# Patient Record
Sex: Female | Born: 1970
Health system: Southern US, Community
[De-identification: ages and names within clinical notes are randomized; demographics above are authoritative.]

## PROBLEM LIST (undated history)

## (undated) DIAGNOSIS — Z789 Other specified health status: Secondary | ICD-10-CM

## (undated) DIAGNOSIS — M549 Dorsalgia, unspecified: Secondary | ICD-10-CM

## (undated) DIAGNOSIS — Z9889 Other specified postprocedural states: Secondary | ICD-10-CM

## (undated) DIAGNOSIS — R112 Nausea with vomiting, unspecified: Secondary | ICD-10-CM

## (undated) DIAGNOSIS — G8929 Other chronic pain: Secondary | ICD-10-CM

## (undated) HISTORY — PX: BREAST BIOPSY: SHX20

## (undated) HISTORY — PX: AUGMENTATION MAMMAPLASTY: SUR837

## (undated) HISTORY — PX: BREAST ENHANCEMENT SURGERY: SHX7

---

## 1998-05-17 ENCOUNTER — Other Ambulatory Visit: Admission: RE | Admit: 1998-05-17 | Discharge: 1998-05-17 | Payer: Self-pay | Admitting: Obstetrics and Gynecology

## 1999-05-28 ENCOUNTER — Other Ambulatory Visit: Admission: RE | Admit: 1999-05-28 | Discharge: 1999-05-28 | Payer: Self-pay | Admitting: Obstetrics and Gynecology

## 2000-09-17 ENCOUNTER — Other Ambulatory Visit: Admission: RE | Admit: 2000-09-17 | Discharge: 2000-09-17 | Payer: Self-pay | Admitting: Obstetrics & Gynecology

## 2001-04-23 ENCOUNTER — Inpatient Hospital Stay (HOSPITAL_COMMUNITY): Admission: AD | Admit: 2001-04-23 | Discharge: 2001-04-25 | Payer: Self-pay | Admitting: Obstetrics & Gynecology

## 2001-06-06 ENCOUNTER — Other Ambulatory Visit: Admission: RE | Admit: 2001-06-06 | Discharge: 2001-06-06 | Payer: Self-pay | Admitting: Obstetrics & Gynecology

## 2002-07-10 ENCOUNTER — Other Ambulatory Visit: Admission: RE | Admit: 2002-07-10 | Discharge: 2002-07-10 | Payer: Self-pay | Admitting: Obstetrics & Gynecology

## 2009-11-28 ENCOUNTER — Other Ambulatory Visit: Admission: RE | Admit: 2009-11-28 | Discharge: 2009-11-28 | Payer: Self-pay | Admitting: Family Medicine

## 2010-04-07 ENCOUNTER — Encounter: Admission: RE | Admit: 2010-04-07 | Discharge: 2010-04-07 | Payer: Self-pay | Admitting: Family Medicine

## 2010-10-17 NOTE — H&P (Signed)
[facility]  Patient:    Tracey Floyd, VIGER Visit Number: [id number] MRN: [id number]          Service Type: OBS Location: 910B [id number] Attending Physician:  Esmeralda Arthur Dictated by:   Genia Del, M.D. Admit Date:  04/23/2001                           History and Physical  DATE OF BIRTH:                  08/31/1970  IDENTIFYING INFORMATION:        Ms. Meredeth Ide is a 40 year old G1, last menstrual period on July 09, 2000, for an expected date of delivery April 16, 2001, at [redacted] weeks gestation.  REASON FOR INDUCTION:           Post dates.  HISTORY OF PRESENT ILLNESS:     Fetal movements positive, no vaginal bleeding, no fluid leak, no regular uterine contractions, no PIH symptoms.  An ultrasound was done after four weeks revealing appropriate for gestational age fetus with adequate amounts of amniotic fluid, cephalic presentation. Her cervix was favorable.  PAST MEDICAL HISTORY:           Positive for IBS.  PAST SURGICAL HISTORY:          Negative.  FAMILY HISTORY:                 Positive for breast cancer in maternal grandmother and diabetes mellitus.  MEDICATIONS:                    Prenatal vitamins.  ALLERGIES:                      No known drug allergies.  SOCIAL HISTORY:                 Nonsmoker.  Married.  HISTORY OF PRESENT PREGNANCY:   First trimester normal.  Labs showed hemoglobin of 12.5, platelets 234,000.  Blood group A positive.  Antibodies negative.  RPR nonreactive.  HBsAg negative.  HIV nonreactive.  Rubella immune.  Gonorrhea and Chlamydia are negative.  Pap test within normal limits.  In the second trimester, she had a triple test which was within normal limits. Her ultrasound revealed anatomy was within normal limits with an anterior low lying placenta.  The placenta was rechecked and was normal at 28+ weeks.  One hour GTT was within normal limits at 135 at 28+ weeks.  An ultrasound was done at  36+ weeks for clinically enlarged for gestational age.  Estimated fetal weight was at the 76th percentile.  Amniotic fluid was at the [facility]. Group B strep at 36+ weeks was positive.  A repeat ultrasound at 40 weeks showed appropriate for gestational age.  Blood pressures remained normal throughout pregnancy.  REVIEW OF SYSTEMS:              Constitutional negative.  HEENT negative. Respiratory negative.  Cardiovascular negative.  Gastrointestinal negative. Urinary negative.  Dermatologic, endocrinologic and neurologic negative.  PHYSICAL EXAMINATION:  GENERAL:                        No apparent distress.  VITAL SIGNS:                    Blood pressure is normal.  Afebrile.  Pulse regular.  LUNGS:                          Clear bilaterally.  HEART:                          Regular cardiac rhythm, no murmur.  ABDOMEN:                        The abdomen is gravid.  Uterine height is appropriate.  Cephalic presentation.  PELVIC:                         Vaginal exam on admission 3 cm dilated, 80% effaced.  Posterior vertex -2.  Lower limbs within normal limits.  No clonus. Monitoring of fetal heart rate reactive.  Baseline is 140.  No decelerations. No regular uterine contractions.  IMPRESSION:                     1. G1 at [redacted] weeks gestation for induction                                    post dates.                                 2. Fetal well being reassuring.                                 3. Group B strep positive.  PLAN:                           1. Admit to labor and delivery for induction,                                    artificial rupture of membranes and low                                    dose Pitocin.                                 2. Start penicillin G per protocol.                                 3. Expectant management towards probable                                    vaginal delivery. Dictated by:   Genia Del, M.D. Attending Physician:   Esmeralda Arthur DD:  04/24/01 TD:  04/24/01 Job: [id number]

## 2011-03-23 ENCOUNTER — Other Ambulatory Visit: Payer: Self-pay | Admitting: Family Medicine

## 2011-03-23 DIAGNOSIS — Z1231 Encounter for screening mammogram for malignant neoplasm of breast: Secondary | ICD-10-CM

## 2011-03-31 ENCOUNTER — Ambulatory Visit: Payer: BC Managed Care – PPO | Attending: Family Medicine | Admitting: Physical Therapy

## 2011-03-31 DIAGNOSIS — M545 Low back pain, unspecified: Secondary | ICD-10-CM | POA: Insufficient documentation

## 2011-03-31 DIAGNOSIS — M25519 Pain in unspecified shoulder: Secondary | ICD-10-CM | POA: Insufficient documentation

## 2011-03-31 DIAGNOSIS — IMO0001 Reserved for inherently not codable concepts without codable children: Secondary | ICD-10-CM | POA: Insufficient documentation

## 2011-04-01 ENCOUNTER — Ambulatory Visit: Payer: Self-pay | Admitting: Physical Therapy

## 2011-04-02 ENCOUNTER — Ambulatory Visit: Payer: BC Managed Care – PPO | Attending: Family Medicine

## 2011-04-02 DIAGNOSIS — M25519 Pain in unspecified shoulder: Secondary | ICD-10-CM | POA: Insufficient documentation

## 2011-04-02 DIAGNOSIS — IMO0001 Reserved for inherently not codable concepts without codable children: Secondary | ICD-10-CM | POA: Insufficient documentation

## 2011-04-02 DIAGNOSIS — M545 Low back pain, unspecified: Secondary | ICD-10-CM | POA: Insufficient documentation

## 2011-04-07 ENCOUNTER — Ambulatory Visit: Payer: BC Managed Care – PPO

## 2011-04-07 ENCOUNTER — Ambulatory Visit
Admission: RE | Admit: 2011-04-07 | Discharge: 2011-04-07 | Disposition: A | Discharge status: Home / Self Care | Payer: BC Managed Care – PPO | Source: Ambulatory Visit | Attending: Family Medicine | Admitting: Family Medicine

## 2011-04-07 DIAGNOSIS — Z1231 Encounter for screening mammogram for malignant neoplasm of breast: Secondary | ICD-10-CM

## 2011-04-10 ENCOUNTER — Ambulatory Visit: Payer: BC Managed Care – PPO | Admitting: Physical Therapy

## 2011-04-15 ENCOUNTER — Encounter: Payer: BC Managed Care – PPO | Admitting: Physical Therapy

## 2011-04-15 ENCOUNTER — Other Ambulatory Visit: Payer: Self-pay | Admitting: Family Medicine

## 2011-04-15 DIAGNOSIS — R928 Other abnormal and inconclusive findings on diagnostic imaging of breast: Secondary | ICD-10-CM

## 2011-04-16 ENCOUNTER — Ambulatory Visit: Payer: BC Managed Care – PPO

## 2011-04-17 ENCOUNTER — Ambulatory Visit
Admission: RE | Admit: 2011-04-17 | Discharge: 2011-04-17 | Disposition: A | Discharge status: Home / Self Care | Payer: BC Managed Care – PPO | Source: Ambulatory Visit | Attending: Family Medicine | Admitting: Family Medicine

## 2011-04-17 ENCOUNTER — Ambulatory Visit: Payer: BC Managed Care – PPO

## 2011-04-17 DIAGNOSIS — R928 Other abnormal and inconclusive findings on diagnostic imaging of breast: Secondary | ICD-10-CM

## 2012-05-02 ENCOUNTER — Other Ambulatory Visit: Payer: Self-pay | Admitting: Family Medicine

## 2012-05-02 DIAGNOSIS — R921 Mammographic calcification found on diagnostic imaging of breast: Secondary | ICD-10-CM

## 2012-05-09 ENCOUNTER — Ambulatory Visit
Admission: RE | Admit: 2012-05-09 | Discharge: 2012-05-09 | Disposition: A | Discharge status: Home / Self Care | Payer: BC Managed Care – PPO | Source: Ambulatory Visit | Attending: Family Medicine | Admitting: Family Medicine

## 2012-05-09 DIAGNOSIS — R921 Mammographic calcification found on diagnostic imaging of breast: Secondary | ICD-10-CM

## 2012-07-26 ENCOUNTER — Other Ambulatory Visit: Payer: Self-pay | Admitting: Orthopedic Surgery

## 2012-07-27 ENCOUNTER — Encounter (HOSPITAL_COMMUNITY): Payer: Self-pay | Admitting: Pharmacy Technician

## 2012-08-04 NOTE — Pre-Procedure Instructions (Signed)
Tracey Floyd  08/04/2012   Your procedure is scheduled on:  Wednesday, 08/10/2012  Report to [facility] at 6:30 AM.  Call this number if you have problems the morning of surgery: [phone]   Remember:   Do not eat food or drink liquids after midnight.   Take these medicines the morning of surgery with A SIP OF WATER: Hydrocodone (pain pill)   Do not wear jewelry, make-up or nail polish.  Do not wear lotions, powders, or perfumes. You may wear deodorant.  Do not shave 48 hours prior to surgery. Men may shave face and neck.  Do not bring valuables to the hospital.  Contacts, dentures or bridgework may not be worn into surgery.  Leave suitcase in the car. After surgery it may be brought to your room.  For patients admitted to the hospital, checkout time is 11:00 AM the day of  discharge.   Patients discharged the day of surgery will not be allowed to drive  home.  Name and phone number of your driver:   Special Instructions: Shower using CHG 2 nights before surgery and the night before surgery.  If you shower the day of surgery use CHG.  Use special wash - you have one bottle of CHG for all showers.  You should use approximately 1/3 of the bottle for each shower.   Please read over the following fact sheets that you were given: Pain Booklet, Coughing and Deep Breathing, Blood Transfusion Information and Surgical Site Infection Prevention

## 2012-08-05 ENCOUNTER — Encounter (HOSPITAL_COMMUNITY)
Admission: RE | Admit: 2012-08-05 | Discharge: 2012-08-05 | Disposition: A | Discharge status: Home / Self Care | Payer: BC Managed Care – PPO | Source: Ambulatory Visit | Attending: Orthopedic Surgery | Admitting: Orthopedic Surgery

## 2012-08-05 ENCOUNTER — Encounter (HOSPITAL_COMMUNITY): Payer: Self-pay

## 2012-08-05 HISTORY — DX: Other specified health status: Z78.9

## 2012-08-05 HISTORY — DX: Other specified postprocedural states: Z98.890

## 2012-08-05 HISTORY — DX: Nausea with vomiting, unspecified: R11.2

## 2012-08-05 HISTORY — DX: Dorsalgia, unspecified: M54.9

## 2012-08-05 HISTORY — DX: Other chronic pain: G89.29

## 2012-08-05 LAB — CBC WITH DIFFERENTIAL/PLATELET
Basophils Relative: 1 % (ref 0–1)
Eosinophils Absolute: 0.3 10*3/uL (ref 0.0–0.7)
HCT: 38.6 % (ref 36.0–46.0)
Hemoglobin: 13.4 g/dL (ref 12.0–15.0)
MCH: 33.8 pg (ref 26.0–34.0)
MCHC: 34.7 g/dL (ref 30.0–36.0)
Monocytes Absolute: 0.5 10*3/uL (ref 0.1–1.0)
Monocytes Relative: 11 % (ref 3–12)
Neutrophils Relative %: 52 % (ref 43–77)

## 2012-08-05 LAB — URINALYSIS, ROUTINE W REFLEX MICROSCOPIC
Bilirubin Urine: NEGATIVE
Ketones, ur: NEGATIVE mg/dL
Nitrite: NEGATIVE
Urobilinogen, UA: 0.2 mg/dL (ref 0.0–1.0)
pH: 6 (ref 5.0–8.0)

## 2012-08-05 LAB — PROTIME-INR
INR: 1.06 (ref 0.00–1.49)
Prothrombin Time: 13.7 seconds (ref 11.6–15.2)

## 2012-08-05 LAB — SURGICAL PCR SCREEN: Staphylococcus aureus: NEGATIVE

## 2012-08-05 LAB — TYPE AND SCREEN: Antibody Screen: NEGATIVE

## 2012-08-05 LAB — COMPREHENSIVE METABOLIC PANEL
Albumin: 4.2 g/dL (ref 3.5–5.2)
BUN: 12 mg/dL (ref 6–23)
Creatinine, Ser: 0.7 mg/dL (ref 0.50–1.10)
Total Protein: 7 g/dL (ref 6.0–8.3)

## 2012-08-05 MED ORDER — POVIDONE-IODINE 7.5 % EX SOLN: Freq: Once | CUTANEOUS | Status: DC

## 2012-08-05 NOTE — Progress Notes (Signed)
Pt doesn't have a cardiologist  Denies ever having an echo/stress test/heart cath  [facility] is Dr.Elizabeth Barnes-Medical MD   Denies ekg or cxr in past yr

## 2012-08-09 MED ORDER — CEFAZOLIN SODIUM-DEXTROSE 2-3 GM-% IV SOLR
2.0000 g | INTRAVENOUS | Status: AC
  Administered 2012-08-10: 2 g via INTRAVENOUS
  Filled 2012-08-09: qty 50

## 2012-08-10 ENCOUNTER — Encounter (HOSPITAL_COMMUNITY): Payer: Self-pay | Admitting: *Deleted

## 2012-08-10 ENCOUNTER — Ambulatory Visit (HOSPITAL_COMMUNITY): Payer: BC Managed Care – PPO | Admitting: Anesthesiology

## 2012-08-10 ENCOUNTER — Encounter (HOSPITAL_COMMUNITY): Payer: Self-pay | Admitting: Anesthesiology

## 2012-08-10 ENCOUNTER — Ambulatory Visit (HOSPITAL_COMMUNITY)
Admission: RE | Admit: 2012-08-10 | Discharge: 2012-08-10 | Disposition: A | Discharge status: Home / Self Care | Payer: BC Managed Care – PPO | Source: Ambulatory Visit | Attending: Orthopedic Surgery | Admitting: Orthopedic Surgery

## 2012-08-10 ENCOUNTER — Encounter (HOSPITAL_COMMUNITY)
Admission: RE | Disposition: A | Discharge status: Home / Self Care | Payer: Self-pay | Source: Ambulatory Visit | Attending: Orthopedic Surgery

## 2012-08-10 ENCOUNTER — Ambulatory Visit (HOSPITAL_COMMUNITY): Payer: BC Managed Care – PPO

## 2012-08-10 DIAGNOSIS — M5137 Other intervertebral disc degeneration, lumbosacral region: Secondary | ICD-10-CM | POA: Insufficient documentation

## 2012-08-10 DIAGNOSIS — Z79899 Other long term (current) drug therapy: Secondary | ICD-10-CM | POA: Insufficient documentation

## 2012-08-10 DIAGNOSIS — M51379 Other intervertebral disc degeneration, lumbosacral region without mention of lumbar back pain or lower extremity pain: Secondary | ICD-10-CM | POA: Insufficient documentation

## 2012-08-10 DIAGNOSIS — M5380 Other specified dorsopathies, site unspecified: Secondary | ICD-10-CM | POA: Insufficient documentation

## 2012-08-10 HISTORY — PX: LUMBAR LAMINECTOMY/DECOMPRESSION MICRODISCECTOMY: SHX5026

## 2012-08-10 SURGERY — LUMBAR LAMINECTOMY/DECOMPRESSION MICRODISCECTOMY
Anesthesia: General | Site: Spine Lumbar | Laterality: Left | Wound class: Clean

## 2012-08-10 MED ORDER — METOCLOPRAMIDE HCL 5 MG/ML IJ SOLN: 10.0000 mg | Freq: Once | INTRAMUSCULAR | Status: DC | PRN

## 2012-08-10 MED ORDER — METHYLPREDNISOLONE ACETATE 40 MG/ML IJ SUSP
INTRAMUSCULAR | Status: AC
  Filled 2012-08-10: qty 1

## 2012-08-10 MED ORDER — FENTANYL CITRATE 0.05 MG/ML IJ SOLN
INTRAMUSCULAR | Status: DC | PRN
  Administered 2012-08-10: 50 ug via INTRAVENOUS
  Administered 2012-08-10: 100 ug via INTRAVENOUS

## 2012-08-10 MED ORDER — SENNOSIDES-DOCUSATE SODIUM 8.6-50 MG PO TABS: 1.0000 | ORAL_TABLET | Freq: Every evening | ORAL | Status: DC | PRN

## 2012-08-10 MED ORDER — ALUM & MAG HYDROXIDE-SIMETH 200-200-20 MG/5ML PO SUSP: 30.0000 mL | Freq: Four times a day (QID) | ORAL | Status: DC | PRN

## 2012-08-10 MED ORDER — THROMBIN 20000 UNITS EX SOLR
CUTANEOUS | Status: DC | PRN
  Administered 2012-08-10: 09:00:00 via TOPICAL

## 2012-08-10 MED ORDER — PROPOFOL 10 MG/ML IV BOLUS
INTRAVENOUS | Status: DC | PRN
  Administered 2012-08-10: 200 mg via INTRAVENOUS

## 2012-08-10 MED ORDER — LIDOCAINE HCL 4 % MT SOLN
OROMUCOSAL | Status: DC | PRN
  Administered 2012-08-10: 4 mL via TOPICAL

## 2012-08-10 MED ORDER — HEMOSTATIC AGENTS (NO CHARGE) OPTIME
TOPICAL | Status: DC | PRN
  Administered 2012-08-10: 1

## 2012-08-10 MED ORDER — SODIUM CHLORIDE 0.9 % IJ SOLN: 3.0000 mL | INTRAMUSCULAR | Status: DC | PRN

## 2012-08-10 MED ORDER — BISACODYL 5 MG PO TBEC: 5.0000 mg | DELAYED_RELEASE_TABLET | Freq: Every day | ORAL | Status: DC | PRN

## 2012-08-10 MED ORDER — SODIUM CHLORIDE 0.9 % IV SOLN: 250.0000 mL | INTRAVENOUS | Status: DC

## 2012-08-10 MED ORDER — DIAZEPAM 5 MG PO TABS
5.0000 mg | ORAL_TABLET | Freq: Four times a day (QID) | ORAL | Status: DC | PRN
  Administered 2012-08-10: 5 mg via ORAL

## 2012-08-10 MED ORDER — DIAZEPAM 5 MG PO TABS
ORAL_TABLET | ORAL | Status: AC
  Filled 2012-08-10: qty 1

## 2012-08-10 MED ORDER — LACTATED RINGERS IV SOLN
INTRAVENOUS | Status: DC | PRN
  Administered 2012-08-10 (×2): via INTRAVENOUS

## 2012-08-10 MED ORDER — CEFAZOLIN SODIUM 1-5 GM-% IV SOLN: 1.0000 g | Freq: Three times a day (TID) | INTRAVENOUS | Status: DC

## 2012-08-10 MED ORDER — NEOSTIGMINE METHYLSULFATE 1 MG/ML IJ SOLN
INTRAMUSCULAR | Status: DC | PRN
  Administered 2012-08-10: 3 mg via INTRAVENOUS

## 2012-08-10 MED ORDER — HYDROMORPHONE HCL PF 1 MG/ML IJ SOLN
0.2500 mg | INTRAMUSCULAR | Status: DC | PRN
  Administered 2012-08-10 (×4): 0.5 mg via INTRAVENOUS

## 2012-08-10 MED ORDER — GLYCOPYRROLATE 0.2 MG/ML IJ SOLN
INTRAMUSCULAR | Status: DC | PRN
  Administered 2012-08-10: .4 mg via INTRAVENOUS

## 2012-08-10 MED ORDER — ACETAMINOPHEN 650 MG RE SUPP: 650.0000 mg | RECTAL | Status: DC | PRN

## 2012-08-10 MED ORDER — PHENOL 1.4 % MT LIQD: 1.0000 | OROMUCOSAL | Status: DC | PRN

## 2012-08-10 MED ORDER — ONDANSETRON HCL 4 MG/2ML IJ SOLN
INTRAMUSCULAR | Status: DC | PRN
  Administered 2012-08-10: 4 mg via INTRAVENOUS

## 2012-08-10 MED ORDER — MENTHOL 3 MG MT LOZG: 1.0000 | LOZENGE | OROMUCOSAL | Status: DC | PRN

## 2012-08-10 MED ORDER — DEXAMETHASONE SODIUM PHOSPHATE 4 MG/ML IJ SOLN
INTRAMUSCULAR | Status: DC | PRN
  Administered 2012-08-10: 4 mg via INTRAVENOUS

## 2012-08-10 MED ORDER — HYDROCODONE-ACETAMINOPHEN 5-325 MG PO TABS
ORAL_TABLET | ORAL | Status: AC
  Filled 2012-08-10: qty 2

## 2012-08-10 MED ORDER — HYDROCODONE-ACETAMINOPHEN 5-325 MG PO TABS
1.0000 | ORAL_TABLET | ORAL | Status: DC | PRN
  Administered 2012-08-10: 2 via ORAL

## 2012-08-10 MED ORDER — HYDROMORPHONE HCL PF 1 MG/ML IJ SOLN
INTRAMUSCULAR | Status: AC
  Filled 2012-08-10: qty 1

## 2012-08-10 MED ORDER — OXYCODONE-ACETAMINOPHEN 5-325 MG PO TABS: 1.0000 | ORAL_TABLET | ORAL | Status: DC | PRN

## 2012-08-10 MED ORDER — METHYLPREDNISOLONE ACETATE 40 MG/ML IJ SUSP
INTRAMUSCULAR | Status: DC | PRN
  Administered 2012-08-10: 40 mg

## 2012-08-10 MED ORDER — THROMBIN 20000 UNITS EX SOLR
CUTANEOUS | Status: AC
  Filled 2012-08-10: qty 20000

## 2012-08-10 MED ORDER — BUPIVACAINE-EPINEPHRINE 0.25% -1:200000 IJ SOLN
INTRAMUSCULAR | Status: DC | PRN
  Administered 2012-08-10: 10 mL

## 2012-08-10 MED ORDER — SODIUM CHLORIDE 0.9 % IJ SOLN: 3.0000 mL | Freq: Two times a day (BID) | INTRAMUSCULAR | Status: DC

## 2012-08-10 MED ORDER — MIDAZOLAM HCL 5 MG/5ML IJ SOLN
INTRAMUSCULAR | Status: DC | PRN
  Administered 2012-08-10: 2 mg via INTRAVENOUS

## 2012-08-10 MED ORDER — INDIGOTINDISULFONATE SODIUM 8 MG/ML IJ SOLN
INTRAMUSCULAR | Status: AC
  Filled 2012-08-10: qty 5

## 2012-08-10 MED ORDER — INDIGOTINDISULFONATE SODIUM 8 MG/ML IJ SOLN
INTRAMUSCULAR | Status: DC | PRN
  Administered 2012-08-10: 1 mL

## 2012-08-10 MED ORDER — ACETAMINOPHEN 325 MG PO TABS: 650.0000 mg | ORAL_TABLET | ORAL | Status: DC | PRN

## 2012-08-10 MED ORDER — MORPHINE SULFATE 2 MG/ML IJ SOLN: 1.0000 mg | INTRAMUSCULAR | Status: DC | PRN

## 2012-08-10 MED ORDER — PHENYLEPHRINE HCL 10 MG/ML IJ SOLN
INTRAMUSCULAR | Status: DC | PRN
  Administered 2012-08-10 (×3): 80 ug via INTRAVENOUS

## 2012-08-10 MED ORDER — ROCURONIUM BROMIDE 100 MG/10ML IV SOLN
INTRAVENOUS | Status: DC | PRN
  Administered 2012-08-10: 40 mg via INTRAVENOUS

## 2012-08-10 MED ORDER — ONDANSETRON HCL 4 MG/2ML IJ SOLN: 4.0000 mg | INTRAMUSCULAR | Status: DC | PRN

## 2012-08-10 MED ORDER — EPHEDRINE SULFATE 50 MG/ML IJ SOLN
INTRAMUSCULAR | Status: DC | PRN
  Administered 2012-08-10: 5 mg via INTRAVENOUS

## 2012-08-10 MED ORDER — OXYCODONE HCL 5 MG PO TABS: 5.0000 mg | ORAL_TABLET | Freq: Once | ORAL | Status: DC | PRN

## 2012-08-10 MED ORDER — OXYCODONE HCL 5 MG/5ML PO SOLN: 5.0000 mg | Freq: Once | ORAL | Status: DC | PRN

## 2012-08-10 MED ORDER — BUPIVACAINE-EPINEPHRINE 0.25% -1:200000 IJ SOLN
INTRAMUSCULAR | Status: AC
  Filled 2012-08-10: qty 1

## 2012-08-10 MED ORDER — LIDOCAINE HCL (CARDIAC) 20 MG/ML IV SOLN
INTRAVENOUS | Status: DC | PRN
  Administered 2012-08-10: 50 mg via INTRAVENOUS

## 2012-08-10 MED ORDER — 0.9 % SODIUM CHLORIDE (POUR BTL) OPTIME
TOPICAL | Status: DC | PRN
  Administered 2012-08-10: 1000 mL

## 2012-08-10 SURGICAL SUPPLY — 66 items
BENZOIN TINCTURE PRP APPL 2/3 (GAUZE/BANDAGES/DRESSINGS) ×2 IMPLANT
BUR ROUND PRECISION 4.0 (BURR) ×2 IMPLANT
CANISTER SUCTION 2500CC (MISCELLANEOUS) ×2 IMPLANT
CLOTH BEACON ORANGE TIMEOUT ST (SAFETY) ×2 IMPLANT
CLSR STERI-STRIP ANTIMIC 1/2X4 (GAUZE/BANDAGES/DRESSINGS) ×2 IMPLANT
CORDS BIPOLAR (ELECTRODE) ×2 IMPLANT
COVER SURGICAL LIGHT HANDLE (MISCELLANEOUS) ×2 IMPLANT
DECANTER SPIKE VIAL GLASS SM (MISCELLANEOUS) ×2 IMPLANT
DRAIN CHANNEL 15F RND FF W/TCR (WOUND CARE) IMPLANT
DRAPE POUCH INSTRU U-SHP 10X18 (DRAPES) ×4 IMPLANT
DRAPE SURG 17X23 STRL (DRAPES) ×8 IMPLANT
DURAPREP 26ML APPLICATOR (WOUND CARE) ×2 IMPLANT
ELECT BLADE 4.0 EZ CLEAN MEGAD (MISCELLANEOUS)
ELECT CAUTERY BLADE 6.4 (BLADE) ×2 IMPLANT
ELECT REM PT RETURN 9FT ADLT (ELECTROSURGICAL) ×2
ELECTRODE BLDE 4.0 EZ CLN MEGD (MISCELLANEOUS) IMPLANT
ELECTRODE REM PT RTRN 9FT ADLT (ELECTROSURGICAL) ×1 IMPLANT
EVACUATOR SILICONE 100CC (DRAIN) IMPLANT
FILTER STRAW FLUID ASPIR (MISCELLANEOUS) ×2 IMPLANT
GAUZE SPONGE 4X4 16PLY XRAY LF (GAUZE/BANDAGES/DRESSINGS) ×4 IMPLANT
GLOVE BIO SURGEON STRL SZ7 (GLOVE) ×2 IMPLANT
GLOVE BIO SURGEON STRL SZ8 (GLOVE) ×2 IMPLANT
GLOVE BIOGEL PI IND STRL 7.0 (GLOVE) ×1 IMPLANT
GLOVE BIOGEL PI IND STRL 8 (GLOVE) ×1 IMPLANT
GLOVE BIOGEL PI INDICATOR 7.0 (GLOVE) ×1
GLOVE BIOGEL PI INDICATOR 8 (GLOVE) ×1
GOWN PREVENTION PLUS LG XLONG (DISPOSABLE) ×2 IMPLANT
GOWN STRL NON-REIN LRG LVL3 (GOWN DISPOSABLE) ×4 IMPLANT
IV CATH 14GX2 1/4 (CATHETERS) ×2 IMPLANT
KIT BASIN OR (CUSTOM PROCEDURE TRAY) ×2 IMPLANT
KIT ROOM TURNOVER OR (KITS) ×2 IMPLANT
MATRIX HEMOSTAT SURGIFLO (HEMOSTASIS) ×2 IMPLANT
NEEDLE 18GX1X1/2 (RX/OR ONLY) (NEEDLE) ×2 IMPLANT
NEEDLE 22X1 1/2 (OR ONLY) (NEEDLE) IMPLANT
NEEDLE HYPO 25GX1X1/2 BEV (NEEDLE) ×2 IMPLANT
NEEDLE SPNL 18GX3.5 QUINCKE PK (NEEDLE) ×4 IMPLANT
NS IRRIG 1000ML POUR BTL (IV SOLUTION) ×2 IMPLANT
PACK LAMINECTOMY ORTHO (CUSTOM PROCEDURE TRAY) ×2 IMPLANT
PACK UNIVERSAL I (CUSTOM PROCEDURE TRAY) ×2 IMPLANT
PAD ARMBOARD 7.5X6 YLW CONV (MISCELLANEOUS) ×4 IMPLANT
PATTIES SURGICAL .5 X.5 (GAUZE/BANDAGES/DRESSINGS) IMPLANT
PATTIES SURGICAL .5 X1 (DISPOSABLE) ×2 IMPLANT
SPONGE GAUZE 4X4 12PLY (GAUZE/BANDAGES/DRESSINGS) ×2 IMPLANT
SPONGE INTESTINAL PEANUT (DISPOSABLE) ×2 IMPLANT
SPONGE SURGIFOAM ABS GEL 100 (HEMOSTASIS) ×2 IMPLANT
STRIP CLOSURE SKIN 1/2X4 (GAUZE/BANDAGES/DRESSINGS) IMPLANT
SURGIFLO TRUKIT (HEMOSTASIS) IMPLANT
SUT BONE WAX W31G (SUTURE) ×2 IMPLANT
SUT MNCRL AB 4-0 PS2 18 (SUTURE) ×2 IMPLANT
SUT VIC AB 0 CT1 18XCR BRD 8 (SUTURE) IMPLANT
SUT VIC AB 0 CT1 27 (SUTURE)
SUT VIC AB 0 CT1 27XBRD ANBCTR (SUTURE) IMPLANT
SUT VIC AB 0 CT1 8-18 (SUTURE)
SUT VIC AB 1 CT1 18XCR BRD 8 (SUTURE) ×1 IMPLANT
SUT VIC AB 1 CT1 8-18 (SUTURE) ×1
SUT VIC AB 2-0 CT2 18 VCP726D (SUTURE) ×2 IMPLANT
SYR 20CC LL (SYRINGE) IMPLANT
SYR BULB IRRIGATION 50ML (SYRINGE) ×2 IMPLANT
SYR CONTROL 10ML LL (SYRINGE) ×4 IMPLANT
SYR TB 1ML 26GX3/8 SAFETY (SYRINGE) ×4 IMPLANT
SYR TB 1ML LUER SLIP (SYRINGE) ×4 IMPLANT
TAPE CLOTH SURG 4X10 WHT LF (GAUZE/BANDAGES/DRESSINGS) ×2 IMPLANT
TOWEL OR 17X24 6PK STRL BLUE (TOWEL DISPOSABLE) ×2 IMPLANT
TOWEL OR 17X26 10 PK STRL BLUE (TOWEL DISPOSABLE) ×2 IMPLANT
WATER STERILE IRR 1000ML POUR (IV SOLUTION) ×2 IMPLANT
YANKAUER SUCT BULB TIP NO VENT (SUCTIONS) ×2 IMPLANT

## 2012-08-10 NOTE — Anesthesia Postprocedure Evaluation (Signed)
Anesthesia Post Note  Patient: Tracey Floyd  Procedure(s) Performed: Procedure(s) (LRB): LUMBAR LAMINECTOMY/DECOMPRESSION MICRODISCECTOMY (Left)  Anesthesia type: General  Patient location: PACU  Post pain: Pain level controlled  Post assessment: Patient's Cardiovascular Status Stable  Last Vitals:  Filed Vitals:   08/10/12 1158  BP:   Pulse: 59  Temp:   Resp: 15    Post vital signs: Reviewed and stable  Level of consciousness: alert  Complications: No apparent anesthesia complications

## 2012-08-10 NOTE — Preoperative (Signed)
Beta Blockers   Reason not to administer Beta Blockers:Not Applicable 

## 2012-08-10 NOTE — Anesthesia Preprocedure Evaluation (Signed)
Anesthesia Evaluation  Patient identified by MRN, date of birth, ID band Patient awake    Reviewed: Allergy & Precautions, H&P , NPO status , Patient's Chart, lab work & pertinent test results, reviewed documented beta blocker date and time   Airway Mallampati: II TM Distance: >3 FB Neck ROM: full    Dental   Pulmonary neg pulmonary ROS,  breath sounds clear to auscultation        Cardiovascular negative cardio ROS  Rhythm:regular     Neuro/Psych negative neurological ROS  negative psych ROS   GI/Hepatic negative GI ROS, Neg liver ROS,   Endo/Other  negative endocrine ROS  Renal/GU negative Renal ROS  negative genitourinary   Musculoskeletal   Abdominal   Peds  Hematology negative hematology ROS (+)   Anesthesia Other Findings See surgeon's H&P   Reproductive/Obstetrics negative OB ROS                           Anesthesia Physical Anesthesia Plan  ASA: I  Anesthesia Plan: General   Post-op Pain Management:    Induction: Intravenous  Airway Management Planned: Oral ETT  Additional Equipment:   Intra-op Plan:   Post-operative Plan:   Informed Consent: I have reviewed the patients History and Physical, chart, labs and discussed the procedure including the risks, benefits and alternatives for the proposed anesthesia with the patient or authorized representative who has indicated his/her understanding and acceptance.   Dental Advisory Given  Plan Discussed with: CRNA and Surgeon  Anesthesia Plan Comments:         Anesthesia Quick Evaluation  

## 2012-08-10 NOTE — H&P (Signed)
PREOPERATIVE H&P  Chief Complaint: left leg pain  HPI: Tracey Floyd is a 42 y.o. female who presents with left leg pain  Past Medical History  Diagnosis Date  . PONV (postoperative nausea and vomiting)   . Chronic back pain     left thigh pain  . Medical history non-contributory    Past Surgical History  Procedure Laterality Date  . Breast enhancement surgery     History   Social History  . Marital Status: Single    Spouse Name: N/A    Number of Children: N/A  . Years of Education: N/A   Social History Main Topics  . Smoking status: Never Smoker   . Smokeless tobacco: None  . Alcohol Use: No  . Drug Use: No  . Sexually Active: Yes    Birth Control/ Protection: IUD   Other Topics Concern  . None   Social History Narrative  . None   History reviewed. No pertinent family history. No Known Allergies Prior to Admission medications   Medication Sig Start Date End Date Taking? Authorizing Provider  HYDROcodone-acetaminophen (NORCO/VICODIN) 5-325 MG per tablet Take 1-2 tablets by mouth every 6 (six) hours as needed for pain.   Yes Historical Provider, MD  Levonorgestrel (MIRENA IU) 1 application by Intrauterine route once.   Yes Historical Provider, MD  Multiple Vitamins-Minerals (MULTIVITAMIN WITH MINERALS) tablet Take 1 tablet by mouth daily.   Yes Historical Provider, MD     All other systems have been reviewed and were otherwise negative with the exception of those mentioned in the HPI and as above.  Physical Exam: Filed Vitals:   08/10/12 0659  BP: 106/73  Pulse: 69  Temp: 98 F (36.7 C)  Resp: 20    General: Alert, no acute distress Cardiovascular: No pedal edema Respiratory: No cyanosis, no use of accessory musculature Skin: No lesions in the area of chief complaint Neurologic: Sensation intact distally Psychiatric: Patient is competent for consent with normal mood and affect Lymphatic: No axillary or cervical lymphadenopathy  MUSCULOSKELETAL: +  SLR on left  Assessment/Plan: Posterior left thigh pain Plan for Procedure(s): LUMBAR LAMINECTOMY/DECOMPRESSION MICRODISCECTOMY L5/S1   Emilee Hero, MD 08/10/2012 8:23 AM

## 2012-08-10 NOTE — Transfer of Care (Signed)
Immediate Anesthesia Transfer of Care Note  Patient: Tracey Floyd  Procedure(s) Performed: Procedure(s) with comments: LUMBAR LAMINECTOMY/DECOMPRESSION MICRODISCECTOMY (Left) - Left sided lumar 5-sacrum 1 microdisectomy  Patient Location: PACU  Anesthesia Type:General  Level of Consciousness: awake, alert  and oriented  Airway & Oxygen Therapy: Patient Spontanous Breathing and Patient connected to nasal cannula oxygen  Post-op Assessment: Report given to PACU RN and Post -op Vital signs reviewed and stable  Post vital signs: Reviewed and stable  Complications: No apparent anesthesia complications

## 2012-08-11 NOTE — Op Note (Signed)
[name], Floyd                ACCOUNT NO.:  [id number]  MEDICAL RECORD NO.:  [id number]  LOCATION:  [facility]                         FACILITY:  [facility]  PHYSICIAN:  Estill Bamberg, MD      DATE OF BIRTH:  December 22, 1970  DATE OF PROCEDURE:  08/10/2012 DATE OF DISCHARGE:  08/10/2012                              OPERATIVE REPORT  PREOPERATIVE DIAGNOSES: 1. Left-sided S1 radiculopathy. 2. Left-sided S2 radiculopathy. 3. Left-sided L5-S1 disk osteophyte complex, compressing the     traversing S1 and S2 nerves.  POSTOPERATIVE DIAGNOSES: 1. Left-sided S1 radiculopathy. 2. Left-sided S2 radiculopathy. 3. Left-sided L5-S1 disk osteophyte complex, compressing the     traversing S1 and S2 nerves.  PROCEDURE:  Left-sided L5-S1 microdiskectomy.  SURGEON:  Estill Bamberg, MD  ASSISTANT:  Jason Coop, [facility]  ANESTHESIA:  General endotracheal anesthesia.  COMPLICATIONS:  None.  DISPOSITION:  Stable.  FINDINGS:  Very chronic appearing calcified left-sided L5-S1 disk fragment with prominent bony spurring posteriorly, causing severe compression of the traversing S1 and S2 nerves.  INDICATIONS FOR PROCEDURE:  Briefly, Tracey Floyd is a very pleasant 42 year old female who did initially present to me on May 09, 2012, with severe pain in her left leg.  At that point in time, the patient did have about a 68-month history of severe pain at the posterior aspect of her left thigh.  Her pain was ongoing at the time of my evaluation, and it was noted to be severe.  We did go forward with an MRI which was clearly notable for a very large pedunculated disk osteophyte complex of the left side, causing severe compression on the traversing S1 and S2 nerves. The patient did have epidural injections which gave her temporary relief, however, pain did quickly recur after each of them.  Upon her evaluation with me on July 25, 2012, we did have a discussion regarding going forward with a microdiskectomy  procedure.  She did understand the risks and benefits of the procedure.  OPERATIVE DETAILS:  On August 10, 2012, the patient was brought to the surgery and general endotracheal anesthesia was administered.  The patient was placed prone on a well-padded flat Jean Rosenthal table with the Wilson frame.  Antibiotics were given.  A time-out procedure was performed.  I then made a 1-inch incision overlying the L5-S1 interspace.  The lamina of L5 and S1 was readily identified.  I did obtain an additional lateral intraoperative radiograph to confirm the appropriate operative level.  The ligamentum flavum between L5 and S1 was taken down.  The traversing S2 nerve was readily identified and the traversing S1 nerve was noted to be significantly displaced against the S1 pedicle towards the left.  Also, it was readily apparent that there was a very obvious and rather significant pedunculated osteophyte complex projecting posteriorly.  This was causing medial displacement of the S2 nerve and lateral displacement of the S1 nerve.  I did tease away the superficial layers overlying the protrusion and it was clear that there were calcified disk fragment and bone fragment, substantially displacing the nerves.  With an assistant holding careful medial retraction of the traversing S2 nerve, I did use a reverse angled Epstein curette to  displace the osteophytes into the intervertebral space.  Once they were free from the surrounding L5 and S1 bone, I was able to remove them using a series of pituitary rongeurs.  I did need to do this liberally both medially and laterally at the posterolateral aspect of the region of the annulus, in order to thoroughly decompress both the S1 and the S2 nerves.  At the termination of this portion of the procedure, I did evaluate the S1 and S2 nerves.  I did feel that excellent decompression was obtained.  Also of note, it was obvious that the patient did have rather substantial and  significant L5-S1 degenerative disk disease.  I was, however, able to confirm adequate decompression of the nerves.  The findings of the significant osteophyte complex posteriorly in the calcified fragment did give me some concern for the future fate of the L5-S1 disk, and an additional surgery may be required, if the disk were to continue to degenerate.  This was certainly be mentioned to Ms. Meredeth Ide and her husband.  In any event, at the termination of this portion of the procedure, I did copiously irrigate the wound.  I did control epidural bleeding using bipolar electrocautery in addition to Surgiflo.  A 40 mg of Depo-Medrol was infiltrated above the epidural space.  The bleeding bone edges were controlled using bone wax.  I then closed the fascia using #1 Vicryl. The subcutaneous layer was then closed using 2-0 Vicryl and the skin was closed using 3-0 Monocryl, benzoin and Steri-Strips.  All instrument counts were correct at the termination of the procedure.  Of note, Jason Coop, was my assistant throughout the procedure and aided in essential retraction and suctioning required throughout the surgery.     Estill Bamberg, MD     MD/MEDQ  D:  08/10/2012  T:  08/11/2012  Job:  [id number]  cc:   Dr. Juluis Rainier

## 2012-08-13 ENCOUNTER — Encounter (HOSPITAL_COMMUNITY): Payer: Self-pay | Admitting: Orthopedic Surgery

## 2015-02-14 ENCOUNTER — Other Ambulatory Visit: Payer: Self-pay | Admitting: Obstetrics & Gynecology

## 2015-02-14 DIAGNOSIS — R928 Other abnormal and inconclusive findings on diagnostic imaging of breast: Secondary | ICD-10-CM

## 2015-02-20 ENCOUNTER — Ambulatory Visit
Admission: RE | Admit: 2015-02-20 | Discharge: 2015-02-20 | Disposition: A | Discharge status: Home / Self Care | Payer: 59 | Source: Ambulatory Visit | Attending: Obstetrics & Gynecology | Admitting: Obstetrics & Gynecology

## 2015-02-20 ENCOUNTER — Other Ambulatory Visit: Payer: Self-pay | Admitting: Obstetrics & Gynecology

## 2015-02-20 DIAGNOSIS — R928 Other abnormal and inconclusive findings on diagnostic imaging of breast: Secondary | ICD-10-CM

## 2015-02-20 DIAGNOSIS — N6320 Unspecified lump in the left breast, unspecified quadrant: Secondary | ICD-10-CM

## 2015-03-04 ENCOUNTER — Other Ambulatory Visit: Payer: Self-pay | Admitting: Obstetrics & Gynecology

## 2015-03-04 ENCOUNTER — Ambulatory Visit
Admission: RE | Admit: 2015-03-04 | Discharge: 2015-03-04 | Disposition: A | Discharge status: Home / Self Care | Payer: 59 | Source: Ambulatory Visit | Attending: Obstetrics & Gynecology | Admitting: Obstetrics & Gynecology

## 2015-03-04 DIAGNOSIS — N6320 Unspecified lump in the left breast, unspecified quadrant: Secondary | ICD-10-CM

## 2016-02-05 ENCOUNTER — Other Ambulatory Visit: Payer: Self-pay | Admitting: Family Medicine

## 2016-02-05 DIAGNOSIS — Z1231 Encounter for screening mammogram for malignant neoplasm of breast: Secondary | ICD-10-CM

## 2016-03-11 ENCOUNTER — Ambulatory Visit
Admission: RE | Admit: 2016-03-11 | Discharge: 2016-03-11 | Disposition: A | Discharge status: Home / Self Care | Payer: 59 | Source: Ambulatory Visit | Attending: Family Medicine | Admitting: Family Medicine

## 2016-03-11 ENCOUNTER — Other Ambulatory Visit: Payer: Self-pay | Admitting: Family Medicine

## 2016-03-11 DIAGNOSIS — Z1231 Encounter for screening mammogram for malignant neoplasm of breast: Secondary | ICD-10-CM

## 2016-07-07 DIAGNOSIS — J069 Acute upper respiratory infection, unspecified: Secondary | ICD-10-CM | POA: Diagnosis not present

## 2017-02-02 ENCOUNTER — Other Ambulatory Visit: Payer: Self-pay | Admitting: Family Medicine

## 2017-02-02 DIAGNOSIS — Z1231 Encounter for screening mammogram for malignant neoplasm of breast: Secondary | ICD-10-CM

## 2017-03-17 ENCOUNTER — Ambulatory Visit
Admission: RE | Admit: 2017-03-17 | Discharge: 2017-03-17 | Disposition: A | Discharge status: Home / Self Care | Payer: BLUE CROSS/BLUE SHIELD | Source: Ambulatory Visit | Attending: Family Medicine | Admitting: Family Medicine

## 2017-03-17 DIAGNOSIS — Z1231 Encounter for screening mammogram for malignant neoplasm of breast: Secondary | ICD-10-CM

## 2017-03-18 ENCOUNTER — Other Ambulatory Visit: Payer: Self-pay | Admitting: Family Medicine

## 2017-03-18 DIAGNOSIS — R928 Other abnormal and inconclusive findings on diagnostic imaging of breast: Secondary | ICD-10-CM

## 2017-03-24 ENCOUNTER — Ambulatory Visit
Admission: RE | Admit: 2017-03-24 | Discharge: 2017-03-24 | Disposition: A | Discharge status: Home / Self Care | Payer: BLUE CROSS/BLUE SHIELD | Source: Ambulatory Visit | Attending: Family Medicine | Admitting: Family Medicine

## 2017-03-24 DIAGNOSIS — R928 Other abnormal and inconclusive findings on diagnostic imaging of breast: Secondary | ICD-10-CM

## 2017-03-24 DIAGNOSIS — N6320 Unspecified lump in the left breast, unspecified quadrant: Secondary | ICD-10-CM | POA: Diagnosis not present

## 2017-03-24 DIAGNOSIS — R922 Inconclusive mammogram: Secondary | ICD-10-CM | POA: Diagnosis not present

## 2017-07-05 DIAGNOSIS — L7211 Pilar cyst: Secondary | ICD-10-CM | POA: Diagnosis not present

## 2017-07-22 DIAGNOSIS — L7211 Pilar cyst: Secondary | ICD-10-CM | POA: Diagnosis not present

## 2018-02-22 DIAGNOSIS — Z0000 Encounter for general adult medical examination without abnormal findings: Secondary | ICD-10-CM | POA: Diagnosis not present

## 2018-02-22 DIAGNOSIS — Z13220 Encounter for screening for lipoid disorders: Secondary | ICD-10-CM | POA: Diagnosis not present

## 2018-03-03 ENCOUNTER — Other Ambulatory Visit: Payer: Self-pay | Admitting: Family Medicine

## 2018-03-03 DIAGNOSIS — Z1231 Encounter for screening mammogram for malignant neoplasm of breast: Secondary | ICD-10-CM

## 2018-03-21 DIAGNOSIS — Z23 Encounter for immunization: Secondary | ICD-10-CM | POA: Diagnosis not present

## 2018-04-04 ENCOUNTER — Ambulatory Visit
Admission: RE | Admit: 2018-04-04 | Discharge: 2018-04-04 | Disposition: A | Discharge status: Home / Self Care | Payer: 59 | Source: Ambulatory Visit | Attending: Family Medicine | Admitting: Family Medicine

## 2018-04-04 DIAGNOSIS — Z1231 Encounter for screening mammogram for malignant neoplasm of breast: Secondary | ICD-10-CM | POA: Diagnosis not present

## 2018-04-20 DIAGNOSIS — J3089 Other allergic rhinitis: Secondary | ICD-10-CM | POA: Diagnosis not present

## 2018-04-20 DIAGNOSIS — R05 Cough: Secondary | ICD-10-CM | POA: Diagnosis not present

## 2018-04-20 DIAGNOSIS — J3081 Allergic rhinitis due to animal (cat) (dog) hair and dander: Secondary | ICD-10-CM | POA: Diagnosis not present

## 2018-04-20 DIAGNOSIS — J301 Allergic rhinitis due to pollen: Secondary | ICD-10-CM | POA: Diagnosis not present

## 2018-05-04 DIAGNOSIS — J3081 Allergic rhinitis due to animal (cat) (dog) hair and dander: Secondary | ICD-10-CM | POA: Diagnosis not present

## 2018-05-04 DIAGNOSIS — J301 Allergic rhinitis due to pollen: Secondary | ICD-10-CM | POA: Diagnosis not present

## 2018-05-04 DIAGNOSIS — J3089 Other allergic rhinitis: Secondary | ICD-10-CM | POA: Diagnosis not present

## 2018-05-12 DIAGNOSIS — J301 Allergic rhinitis due to pollen: Secondary | ICD-10-CM | POA: Diagnosis not present

## 2018-05-12 DIAGNOSIS — J3081 Allergic rhinitis due to animal (cat) (dog) hair and dander: Secondary | ICD-10-CM | POA: Diagnosis not present

## 2018-05-12 DIAGNOSIS — J3089 Other allergic rhinitis: Secondary | ICD-10-CM | POA: Diagnosis not present

## 2018-05-16 DIAGNOSIS — J301 Allergic rhinitis due to pollen: Secondary | ICD-10-CM | POA: Diagnosis not present

## 2018-05-16 DIAGNOSIS — J3081 Allergic rhinitis due to animal (cat) (dog) hair and dander: Secondary | ICD-10-CM | POA: Diagnosis not present

## 2018-05-16 DIAGNOSIS — J3089 Other allergic rhinitis: Secondary | ICD-10-CM | POA: Diagnosis not present

## 2018-05-18 DIAGNOSIS — J3081 Allergic rhinitis due to animal (cat) (dog) hair and dander: Secondary | ICD-10-CM | POA: Diagnosis not present

## 2018-05-18 DIAGNOSIS — J3089 Other allergic rhinitis: Secondary | ICD-10-CM | POA: Diagnosis not present

## 2018-05-18 DIAGNOSIS — J301 Allergic rhinitis due to pollen: Secondary | ICD-10-CM | POA: Diagnosis not present

## 2018-05-23 DIAGNOSIS — J301 Allergic rhinitis due to pollen: Secondary | ICD-10-CM | POA: Diagnosis not present

## 2018-05-23 DIAGNOSIS — J3089 Other allergic rhinitis: Secondary | ICD-10-CM | POA: Diagnosis not present

## 2018-05-23 DIAGNOSIS — J3081 Allergic rhinitis due to animal (cat) (dog) hair and dander: Secondary | ICD-10-CM | POA: Diagnosis not present

## 2018-05-26 DIAGNOSIS — J301 Allergic rhinitis due to pollen: Secondary | ICD-10-CM | POA: Diagnosis not present

## 2018-05-26 DIAGNOSIS — J3089 Other allergic rhinitis: Secondary | ICD-10-CM | POA: Diagnosis not present

## 2018-05-26 DIAGNOSIS — J3081 Allergic rhinitis due to animal (cat) (dog) hair and dander: Secondary | ICD-10-CM | POA: Diagnosis not present

## 2018-05-30 DIAGNOSIS — J3089 Other allergic rhinitis: Secondary | ICD-10-CM | POA: Diagnosis not present

## 2018-05-30 DIAGNOSIS — J301 Allergic rhinitis due to pollen: Secondary | ICD-10-CM | POA: Diagnosis not present

## 2018-05-30 DIAGNOSIS — J3081 Allergic rhinitis due to animal (cat) (dog) hair and dander: Secondary | ICD-10-CM | POA: Diagnosis not present

## 2018-06-02 DIAGNOSIS — J3081 Allergic rhinitis due to animal (cat) (dog) hair and dander: Secondary | ICD-10-CM | POA: Diagnosis not present

## 2018-06-02 DIAGNOSIS — J301 Allergic rhinitis due to pollen: Secondary | ICD-10-CM | POA: Diagnosis not present

## 2018-06-02 DIAGNOSIS — J3089 Other allergic rhinitis: Secondary | ICD-10-CM | POA: Diagnosis not present

## 2018-06-06 DIAGNOSIS — J301 Allergic rhinitis due to pollen: Secondary | ICD-10-CM | POA: Diagnosis not present

## 2018-06-06 DIAGNOSIS — J3089 Other allergic rhinitis: Secondary | ICD-10-CM | POA: Diagnosis not present

## 2018-06-06 DIAGNOSIS — J3081 Allergic rhinitis due to animal (cat) (dog) hair and dander: Secondary | ICD-10-CM | POA: Diagnosis not present

## 2018-06-09 DIAGNOSIS — J3089 Other allergic rhinitis: Secondary | ICD-10-CM | POA: Diagnosis not present

## 2018-06-09 DIAGNOSIS — J3081 Allergic rhinitis due to animal (cat) (dog) hair and dander: Secondary | ICD-10-CM | POA: Diagnosis not present

## 2018-06-09 DIAGNOSIS — J301 Allergic rhinitis due to pollen: Secondary | ICD-10-CM | POA: Diagnosis not present

## 2018-06-16 DIAGNOSIS — J3089 Other allergic rhinitis: Secondary | ICD-10-CM | POA: Diagnosis not present

## 2018-06-16 DIAGNOSIS — J301 Allergic rhinitis due to pollen: Secondary | ICD-10-CM | POA: Diagnosis not present

## 2018-06-16 DIAGNOSIS — J3081 Allergic rhinitis due to animal (cat) (dog) hair and dander: Secondary | ICD-10-CM | POA: Diagnosis not present

## 2018-06-22 DIAGNOSIS — J301 Allergic rhinitis due to pollen: Secondary | ICD-10-CM | POA: Diagnosis not present

## 2018-06-22 DIAGNOSIS — J3081 Allergic rhinitis due to animal (cat) (dog) hair and dander: Secondary | ICD-10-CM | POA: Diagnosis not present

## 2018-06-22 DIAGNOSIS — J3089 Other allergic rhinitis: Secondary | ICD-10-CM | POA: Diagnosis not present

## 2018-06-27 DIAGNOSIS — J3089 Other allergic rhinitis: Secondary | ICD-10-CM | POA: Diagnosis not present

## 2018-06-27 DIAGNOSIS — J3081 Allergic rhinitis due to animal (cat) (dog) hair and dander: Secondary | ICD-10-CM | POA: Diagnosis not present

## 2018-06-27 DIAGNOSIS — J301 Allergic rhinitis due to pollen: Secondary | ICD-10-CM | POA: Diagnosis not present

## 2018-06-29 DIAGNOSIS — J3081 Allergic rhinitis due to animal (cat) (dog) hair and dander: Secondary | ICD-10-CM | POA: Diagnosis not present

## 2018-06-29 DIAGNOSIS — J3089 Other allergic rhinitis: Secondary | ICD-10-CM | POA: Diagnosis not present

## 2018-06-29 DIAGNOSIS — J301 Allergic rhinitis due to pollen: Secondary | ICD-10-CM | POA: Diagnosis not present

## 2018-06-30 DIAGNOSIS — R1010 Upper abdominal pain, unspecified: Secondary | ICD-10-CM | POA: Diagnosis not present

## 2018-07-04 DIAGNOSIS — J3089 Other allergic rhinitis: Secondary | ICD-10-CM | POA: Diagnosis not present

## 2018-07-04 DIAGNOSIS — J301 Allergic rhinitis due to pollen: Secondary | ICD-10-CM | POA: Diagnosis not present

## 2018-07-04 DIAGNOSIS — J3081 Allergic rhinitis due to animal (cat) (dog) hair and dander: Secondary | ICD-10-CM | POA: Diagnosis not present

## 2018-07-11 DIAGNOSIS — J3081 Allergic rhinitis due to animal (cat) (dog) hair and dander: Secondary | ICD-10-CM | POA: Diagnosis not present

## 2018-07-11 DIAGNOSIS — J301 Allergic rhinitis due to pollen: Secondary | ICD-10-CM | POA: Diagnosis not present

## 2018-07-11 DIAGNOSIS — J3089 Other allergic rhinitis: Secondary | ICD-10-CM | POA: Diagnosis not present

## 2018-07-18 DIAGNOSIS — J301 Allergic rhinitis due to pollen: Secondary | ICD-10-CM | POA: Diagnosis not present

## 2018-07-18 DIAGNOSIS — J3081 Allergic rhinitis due to animal (cat) (dog) hair and dander: Secondary | ICD-10-CM | POA: Diagnosis not present

## 2018-07-18 DIAGNOSIS — J3089 Other allergic rhinitis: Secondary | ICD-10-CM | POA: Diagnosis not present

## 2018-07-28 DIAGNOSIS — J3089 Other allergic rhinitis: Secondary | ICD-10-CM | POA: Diagnosis not present

## 2018-07-28 DIAGNOSIS — J301 Allergic rhinitis due to pollen: Secondary | ICD-10-CM | POA: Diagnosis not present

## 2018-07-28 DIAGNOSIS — J3081 Allergic rhinitis due to animal (cat) (dog) hair and dander: Secondary | ICD-10-CM | POA: Diagnosis not present

## 2019-01-31 DIAGNOSIS — J3081 Allergic rhinitis due to animal (cat) (dog) hair and dander: Secondary | ICD-10-CM | POA: Diagnosis not present

## 2019-01-31 DIAGNOSIS — J301 Allergic rhinitis due to pollen: Secondary | ICD-10-CM | POA: Diagnosis not present

## 2019-01-31 DIAGNOSIS — J3089 Other allergic rhinitis: Secondary | ICD-10-CM | POA: Diagnosis not present

## 2019-03-02 DIAGNOSIS — Z0000 Encounter for general adult medical examination without abnormal findings: Secondary | ICD-10-CM | POA: Diagnosis not present

## 2019-03-03 DIAGNOSIS — Z0000 Encounter for general adult medical examination without abnormal findings: Secondary | ICD-10-CM | POA: Diagnosis not present

## 2019-03-03 DIAGNOSIS — Z13220 Encounter for screening for lipoid disorders: Secondary | ICD-10-CM | POA: Diagnosis not present

## 2019-03-08 ENCOUNTER — Other Ambulatory Visit: Payer: Self-pay | Admitting: Family Medicine

## 2019-03-08 DIAGNOSIS — R05 Cough: Secondary | ICD-10-CM | POA: Diagnosis not present

## 2019-03-08 DIAGNOSIS — Z1231 Encounter for screening mammogram for malignant neoplasm of breast: Secondary | ICD-10-CM

## 2019-03-08 DIAGNOSIS — J3081 Allergic rhinitis due to animal (cat) (dog) hair and dander: Secondary | ICD-10-CM | POA: Diagnosis not present

## 2019-03-08 DIAGNOSIS — J3089 Other allergic rhinitis: Secondary | ICD-10-CM | POA: Diagnosis not present

## 2019-03-08 DIAGNOSIS — R0982 Postnasal drip: Secondary | ICD-10-CM | POA: Diagnosis not present

## 2019-03-08 DIAGNOSIS — J301 Allergic rhinitis due to pollen: Secondary | ICD-10-CM | POA: Diagnosis not present

## 2019-03-09 DIAGNOSIS — J3081 Allergic rhinitis due to animal (cat) (dog) hair and dander: Secondary | ICD-10-CM | POA: Diagnosis not present

## 2019-03-09 DIAGNOSIS — J3089 Other allergic rhinitis: Secondary | ICD-10-CM | POA: Diagnosis not present

## 2019-03-09 DIAGNOSIS — J301 Allergic rhinitis due to pollen: Secondary | ICD-10-CM | POA: Diagnosis not present

## 2019-03-23 DIAGNOSIS — J301 Allergic rhinitis due to pollen: Secondary | ICD-10-CM | POA: Diagnosis not present

## 2019-03-23 DIAGNOSIS — J3081 Allergic rhinitis due to animal (cat) (dog) hair and dander: Secondary | ICD-10-CM | POA: Diagnosis not present

## 2019-03-23 DIAGNOSIS — J3089 Other allergic rhinitis: Secondary | ICD-10-CM | POA: Diagnosis not present

## 2019-03-31 DIAGNOSIS — K219 Gastro-esophageal reflux disease without esophagitis: Secondary | ICD-10-CM | POA: Diagnosis not present

## 2019-03-31 DIAGNOSIS — T7840XA Allergy, unspecified, initial encounter: Secondary | ICD-10-CM | POA: Diagnosis not present

## 2019-03-31 DIAGNOSIS — R6889 Other general symptoms and signs: Secondary | ICD-10-CM | POA: Diagnosis not present

## 2019-04-04 DIAGNOSIS — J3089 Other allergic rhinitis: Secondary | ICD-10-CM | POA: Diagnosis not present

## 2019-04-04 DIAGNOSIS — J301 Allergic rhinitis due to pollen: Secondary | ICD-10-CM | POA: Diagnosis not present

## 2019-04-04 DIAGNOSIS — J3081 Allergic rhinitis due to animal (cat) (dog) hair and dander: Secondary | ICD-10-CM | POA: Diagnosis not present

## 2019-04-18 DIAGNOSIS — J301 Allergic rhinitis due to pollen: Secondary | ICD-10-CM | POA: Diagnosis not present

## 2019-04-18 DIAGNOSIS — J3089 Other allergic rhinitis: Secondary | ICD-10-CM | POA: Diagnosis not present

## 2019-04-18 DIAGNOSIS — J3081 Allergic rhinitis due to animal (cat) (dog) hair and dander: Secondary | ICD-10-CM | POA: Diagnosis not present

## 2019-04-21 DIAGNOSIS — Z111 Encounter for screening for respiratory tuberculosis: Secondary | ICD-10-CM | POA: Diagnosis not present

## 2019-04-25 ENCOUNTER — Ambulatory Visit
Admission: RE | Admit: 2019-04-25 | Discharge: 2019-04-25 | Disposition: A | Discharge status: Home / Self Care | Payer: BC Managed Care – PPO | Source: Ambulatory Visit | Attending: Family Medicine | Admitting: Family Medicine

## 2019-04-25 ENCOUNTER — Other Ambulatory Visit: Payer: Self-pay

## 2019-04-25 DIAGNOSIS — Z1231 Encounter for screening mammogram for malignant neoplasm of breast: Secondary | ICD-10-CM

## 2019-05-01 ENCOUNTER — Other Ambulatory Visit: Payer: Self-pay | Admitting: Family Medicine

## 2019-05-01 DIAGNOSIS — R928 Other abnormal and inconclusive findings on diagnostic imaging of breast: Secondary | ICD-10-CM

## 2019-05-02 DIAGNOSIS — J301 Allergic rhinitis due to pollen: Secondary | ICD-10-CM | POA: Diagnosis not present

## 2019-05-02 DIAGNOSIS — J3089 Other allergic rhinitis: Secondary | ICD-10-CM | POA: Diagnosis not present

## 2019-05-02 DIAGNOSIS — J3081 Allergic rhinitis due to animal (cat) (dog) hair and dander: Secondary | ICD-10-CM | POA: Diagnosis not present

## 2019-05-03 ENCOUNTER — Ambulatory Visit: Payer: BC Managed Care – PPO

## 2019-05-03 ENCOUNTER — Other Ambulatory Visit: Payer: Self-pay

## 2019-05-03 ENCOUNTER — Other Ambulatory Visit: Payer: Self-pay | Admitting: Family Medicine

## 2019-05-03 ENCOUNTER — Ambulatory Visit
Admission: RE | Admit: 2019-05-03 | Discharge: 2019-05-03 | Disposition: A | Discharge status: Home / Self Care | Payer: BC Managed Care – PPO | Source: Ambulatory Visit | Attending: Family Medicine | Admitting: Family Medicine

## 2019-05-03 DIAGNOSIS — R922 Inconclusive mammogram: Secondary | ICD-10-CM | POA: Diagnosis not present

## 2019-05-03 DIAGNOSIS — R928 Other abnormal and inconclusive findings on diagnostic imaging of breast: Secondary | ICD-10-CM

## 2019-05-11 ENCOUNTER — Ambulatory Visit (INDEPENDENT_AMBULATORY_CARE_PROVIDER_SITE_OTHER): Payer: BC Managed Care – PPO | Admitting: Allergy

## 2019-05-11 ENCOUNTER — Other Ambulatory Visit: Payer: Self-pay

## 2019-05-11 ENCOUNTER — Encounter: Payer: Self-pay | Admitting: Allergy

## 2019-05-11 VITALS — BP 120/82 | HR 62 | Temp 98.2°F | Resp 16 | Ht 63.0 in | Wt 168.8 lb

## 2019-05-11 DIAGNOSIS — Z03818 Encounter for observation for suspected exposure to other biological agents ruled out: Secondary | ICD-10-CM | POA: Diagnosis not present

## 2019-05-11 DIAGNOSIS — J3089 Other allergic rhinitis: Secondary | ICD-10-CM

## 2019-05-11 DIAGNOSIS — H1013 Acute atopic conjunctivitis, bilateral: Secondary | ICD-10-CM

## 2019-05-11 MED ORDER — FLUTICASONE PROPIONATE 50 MCG/ACT NA SUSP: 2.0000 | Freq: Every day | NASAL | 5 refills | Status: DC

## 2019-05-11 MED ORDER — EPINEPHRINE 0.3 MG/0.3ML IJ SOAJ: 0.3000 mg | INTRAMUSCULAR | 2 refills | Status: DC | PRN

## 2019-05-11 MED ORDER — IPRATROPIUM BROMIDE 0.06 % NA SOLN: 2.0000 | Freq: Three times a day (TID) | NASAL | 5 refills | Status: DC

## 2019-05-11 MED ORDER — LEVOCETIRIZINE DIHYDROCHLORIDE 5 MG PO TABS: 5.0000 mg | ORAL_TABLET | Freq: Every day | ORAL | 5 refills | Status: DC

## 2019-05-11 NOTE — Patient Instructions (Signed)
Allergies  - continue avoidance measures for tree pollens, grass pollens, weed pollens, molds, dust mites, cat, dog  - will restart allergen immunotherapy (allergy shots).  Allergen immunotherapy discussed today including protocol, benefits and risk.  Consent to restart immunotherapy signed today.  Schedule new start injection appointment.   Will prescribe epinephrine device to have with you on injection days.  - continue Levoceterizine 5mg  daily.  Make sure to take prior to injections  - use nasal Ipratropium 2 sprays each nostril 3-4 times a day as needed for nasal drainage  - use Flonase 2 sprays each nostril daily for nasal congestion.  Use for 1-2 weeks at a time before stopping once symptoms improve  - may benefit from nasal saline rinses as well to help flush out the sinuses and help your nasal sprays work more efficiently  Follow-up 6 months or sooner if needed

## 2019-05-11 NOTE — Progress Notes (Signed)
New Patient Note  RE: ALIZON SCHMELING MRN: [id number] DOB: 24-Oct-1970 Date of Office Visit: 05/11/2019  Referring provider: Leighton Ruff, MD Primary care provider: Leighton Ruff, MD  Chief Complaint: allergies, resume shots  History of present illness: MCKYNLIE VANDERSLICE is a 48 y.o. female presenting today for consultation for allergic rhinitis.    She reports allergy symptoms of sneezing, itchy eyes, nasal drainage with drip with cough.   She is currently taking levoceterizine daily.   She will use Flonase as needed for congestion which she states helps some.  she has tried azelastine nasal spray but states she could not tell a difference with degree of drainage with use.  she also has nasal ipratropium which works better than the azelastine and uses it as needed.  She states however she still sometimes will take supplments like mucinex, pseudophed or other decongestant medications to help symptoms.  She has not tried eye drops but states when she is on her antihistamine eye symptoms are pretty much controlled.   She was previously seeing Dr. Clarene Essex, allergist at [place].  She had allergy testing done in 04/2018 that showed positive skin prick testing to birch, ash, KORT mix, sweet Vernal, ragweed mix, both dust mites, cat, Alternaria, Dreschlera.  Intradermal testing was positive to oak, hickory/pecan, Congo, dog, Aspergillus.  She was started on immunotherapy through them and states she was going weekly however when Covid hit their practice decrease the amount of times that they could come to the office and states she eventually was only going once a month.  She stopped injections on November 17 and had gone up to 1-100 concentration 0.3 mL of her pollen vial and 1-10 concentration 0.3 mL of her pets, mold and mite vial. She states she "maybe" could notice a difference but it was not anything significant.  She does not have an epinephrine device.  She  states around the same time where she had to decrease the frequency of her allergy shots due to Covid practices she started having more cough symptoms.  Due to this she states she was not able to get seen in office because of her symptoms and she was prescribed albuterol inhaler and Advair to try to see if this would help with the cough.  She does not feel that this really helped and she is no longer using the Advair.  Her cough is better when she is on her allergy regimen.  She does not have a history of asthma or any respiratory disease.  She has seen ENT, Dr. Redmond Baseman, on March 31, 2019 for complaint of throat clearing.  He feels that she has laryngopharyngeal reflux and she was recommended to take Prilosec twice a day before meals she has been taking this for the past month and does feel that her throat clearing has decreased.  No history of food allergy or eczema.   Review of systems: Review of Systems  Constitutional: Negative for chills, fever and malaise/fatigue.  HENT: Positive for congestion. Negative for ear discharge, nosebleeds, sinus pain and sore throat.   Eyes: Negative for pain, discharge and redness.  Respiratory: Positive for cough. Negative for shortness of breath and wheezing.   Cardiovascular: Negative.   Gastrointestinal: Negative.   Musculoskeletal: Negative.   Skin: Negative for itching and rash.  Neurological: Negative.     All other systems negative unless noted above in HPI  Past medical history: Past Medical History:  Diagnosis Date  . Chronic back  pain    left thigh pain  . Medical history non-contributory   . PONV (postoperative nausea and vomiting)     Past surgical history: Past Surgical History:  Procedure Laterality Date  . AUGMENTATION MAMMAPLASTY Bilateral   . BREAST BIOPSY Left   . BREAST ENHANCEMENT SURGERY    . LUMBAR LAMINECTOMY/DECOMPRESSION MICRODISCECTOMY Left 08/10/2012   Procedure: LUMBAR LAMINECTOMY/DECOMPRESSION MICRODISCECTOMY;   Surgeon: Emilee Hero, MD;  Location: [facility] OR;  Service: Orthopedics;  Laterality: Left;  Left sided lumar 5-sacrum 1 microdisectomy    Family history:  Family History  Problem Relation Age of Onset  . Breast cancer Neg Hx     Social history: Lives in a home without carpeting with electric heating and central cooling.  Cat in the home.  No concern for water damage, mildew or roaches in the home.  She is a Archivist.  She does have a HEPA filter in the home.  She denies a smoking history.  Medication List: Current Outpatient Medications  Medication Sig Dispense Refill  . levocetirizine (XYZAL) 5 MG tablet Take 5 mg by mouth daily.    . Levonorgestrel (MIRENA IU) 1 application by Intrauterine route once.    . Multiple Vitamins-Minerals (MULTIVITAMIN WITH MINERALS) tablet Take 1 tablet by mouth daily.    Marland Kitchen omeprazole (PRILOSEC) 40 MG capsule Take 40 mg by mouth 2 (two) times daily.     No current facility-administered medications for this visit.    Known medication allergies: No Known Allergies   Physical examination: Blood pressure 120/82, pulse 62, temperature 98.2 F (36.8 C), resp. rate 16, height 5\' 3"  (1.6 m), weight 168 lb 12.8 oz (76.6 kg), SpO2 97 %.  General: Alert, interactive, in no acute distress. HEENT: PERRLA, TMs pearly gray, turbinates minimally edematous with clear discharge, post-pharynx non erythematous. Neck: Supple without lymphadenopathy. Lungs: Clear to auscultation without wheezing, rhonchi or rales. {no increased work of breathing. CV: Normal S1, S2 without murmurs. Abdomen: Nondistended, nontender. Skin: Warm and dry, without lesions or rashes. Extremities:  No clubbing, cyanosis or edema. Neuro:   Grossly intact.  Diagnositics/Labs: None today  Assessment and plan:   Allergic rhinitis with conjunctivitis  - continue avoidance measures for tree pollens, grass pollens, weed pollens, molds, dust mites, cat, dog  - will restart  allergen immunotherapy (allergy shots).  Allergen immunotherapy discussed today including protocol, benefits and risk.  Consent to restart immunotherapy signed today.  Schedule new start injection appointment.   Will prescribe epinephrine device to have with you on injection days.  - continue Levoceterizine 5mg  daily.  Make sure to take prior to injections  - use nasal Ipratropium 2 sprays each nostril 3-4 times a day as needed for nasal drainage  - use Flonase 2 sprays each nostril daily for nasal congestion.  Use for 1-2 weeks at a time before stopping once symptoms improve   - consider adding singulair if still having symptoms despite above therapy  - may benefit from nasal saline rinses as well to help flush out the sinuses and help your nasal sprays work more efficiently  -I do not believe that she has asthma and believe that her cough is mostly triggered from her allergies and reflux.  She does not need to continue use of the Advair or albuterol at this time.  Follow-up 6 months or sooner if needed  I appreciate the opportunity to take part in Lorriann's care. Please do not hesitate to contact me with questions.  Sincerely,   Christofer Shen  Nelva Bush, MD Allergy/Immunology [facility]

## 2019-05-16 ENCOUNTER — Telehealth: Payer: Self-pay

## 2019-05-16 NOTE — Telephone Encounter (Signed)
Patient is calling to speak with a nurse about the meds that were sent in for her visit on 05-11-2019.  Please Advise

## 2019-05-17 NOTE — Telephone Encounter (Signed)
Spoke with patient since she was confuse to as what she was suppose to take and advise her on her discharged and explain the difference in the two nasal spray she has.

## 2019-05-19 NOTE — Addendum Note (Signed)
Addended by: Theresia Lo on: 05/19/2019 06:01 PM   Modules accepted: Orders

## 2019-05-22 DIAGNOSIS — J3081 Allergic rhinitis due to animal (cat) (dog) hair and dander: Secondary | ICD-10-CM | POA: Diagnosis not present

## 2019-05-22 NOTE — Progress Notes (Signed)
VIALS EXP 05-21-20 

## 2019-05-23 DIAGNOSIS — J3089 Other allergic rhinitis: Secondary | ICD-10-CM | POA: Diagnosis not present

## 2019-05-30 DIAGNOSIS — K219 Gastro-esophageal reflux disease without esophagitis: Secondary | ICD-10-CM | POA: Diagnosis not present

## 2019-05-30 DIAGNOSIS — Z7289 Other problems related to lifestyle: Secondary | ICD-10-CM | POA: Diagnosis not present

## 2019-06-06 DIAGNOSIS — Z03818 Encounter for observation for suspected exposure to other biological agents ruled out: Secondary | ICD-10-CM | POA: Diagnosis not present

## 2019-06-07 ENCOUNTER — Other Ambulatory Visit: Payer: Self-pay

## 2019-06-07 ENCOUNTER — Ambulatory Visit (INDEPENDENT_AMBULATORY_CARE_PROVIDER_SITE_OTHER): Payer: BC Managed Care – PPO | Admitting: *Deleted

## 2019-06-07 DIAGNOSIS — J309 Allergic rhinitis, unspecified: Secondary | ICD-10-CM

## 2019-06-07 NOTE — Progress Notes (Signed)
Immunotherapy   Patient Details  Name: MARICELLA FILYAW MRN: [id number] Date of Birth: 02-20-1971  06/07/2019  Liston Alba started injections for  POLLEN-PET, MITE-MOLD Following schedule: B  Frequency:1 time per week Epi-Pen: Yes Consent signed and patient instructions given.  Patient started allergy injections today and received .67mL of POLLEN-PET in the RUA and .62mL of MITE-MOLD in the LUA. Patient waited 30 minutes and did not have any issues.   Ashleigh Fernandez-Vernon 06/07/2019, 8:44 AM

## 2019-06-13 ENCOUNTER — Ambulatory Visit (INDEPENDENT_AMBULATORY_CARE_PROVIDER_SITE_OTHER): Payer: BC Managed Care – PPO

## 2019-06-13 DIAGNOSIS — J309 Allergic rhinitis, unspecified: Secondary | ICD-10-CM | POA: Diagnosis not present

## 2019-06-20 ENCOUNTER — Ambulatory Visit (INDEPENDENT_AMBULATORY_CARE_PROVIDER_SITE_OTHER): Payer: BC Managed Care – PPO

## 2019-06-20 DIAGNOSIS — J309 Allergic rhinitis, unspecified: Secondary | ICD-10-CM | POA: Diagnosis not present

## 2019-06-27 ENCOUNTER — Ambulatory Visit (INDEPENDENT_AMBULATORY_CARE_PROVIDER_SITE_OTHER): Payer: BC Managed Care – PPO

## 2019-06-27 DIAGNOSIS — J309 Allergic rhinitis, unspecified: Secondary | ICD-10-CM | POA: Diagnosis not present

## 2019-07-04 ENCOUNTER — Ambulatory Visit (INDEPENDENT_AMBULATORY_CARE_PROVIDER_SITE_OTHER): Payer: BC Managed Care – PPO

## 2019-07-04 DIAGNOSIS — J309 Allergic rhinitis, unspecified: Secondary | ICD-10-CM | POA: Diagnosis not present

## 2019-07-11 ENCOUNTER — Ambulatory Visit (INDEPENDENT_AMBULATORY_CARE_PROVIDER_SITE_OTHER): Payer: BC Managed Care – PPO

## 2019-07-11 DIAGNOSIS — J309 Allergic rhinitis, unspecified: Secondary | ICD-10-CM | POA: Diagnosis not present

## 2019-07-19 ENCOUNTER — Ambulatory Visit (INDEPENDENT_AMBULATORY_CARE_PROVIDER_SITE_OTHER): Payer: BC Managed Care – PPO

## 2019-07-19 DIAGNOSIS — J309 Allergic rhinitis, unspecified: Secondary | ICD-10-CM | POA: Diagnosis not present

## 2019-07-25 ENCOUNTER — Ambulatory Visit (INDEPENDENT_AMBULATORY_CARE_PROVIDER_SITE_OTHER): Payer: BC Managed Care – PPO

## 2019-07-25 DIAGNOSIS — J309 Allergic rhinitis, unspecified: Secondary | ICD-10-CM

## 2019-07-25 DIAGNOSIS — Z20828 Contact with and (suspected) exposure to other viral communicable diseases: Secondary | ICD-10-CM | POA: Diagnosis not present

## 2019-08-04 ENCOUNTER — Ambulatory Visit (INDEPENDENT_AMBULATORY_CARE_PROVIDER_SITE_OTHER): Payer: BC Managed Care – PPO | Admitting: *Deleted

## 2019-08-04 DIAGNOSIS — J309 Allergic rhinitis, unspecified: Secondary | ICD-10-CM

## 2019-08-10 ENCOUNTER — Ambulatory Visit (INDEPENDENT_AMBULATORY_CARE_PROVIDER_SITE_OTHER): Payer: BC Managed Care – PPO

## 2019-08-10 DIAGNOSIS — J309 Allergic rhinitis, unspecified: Secondary | ICD-10-CM

## 2019-08-15 ENCOUNTER — Ambulatory Visit (INDEPENDENT_AMBULATORY_CARE_PROVIDER_SITE_OTHER): Payer: BC Managed Care – PPO

## 2019-08-15 DIAGNOSIS — J309 Allergic rhinitis, unspecified: Secondary | ICD-10-CM | POA: Diagnosis not present

## 2019-08-25 ENCOUNTER — Ambulatory Visit (INDEPENDENT_AMBULATORY_CARE_PROVIDER_SITE_OTHER): Payer: BC Managed Care – PPO

## 2019-08-25 DIAGNOSIS — J309 Allergic rhinitis, unspecified: Secondary | ICD-10-CM

## 2019-08-31 ENCOUNTER — Ambulatory Visit (INDEPENDENT_AMBULATORY_CARE_PROVIDER_SITE_OTHER): Payer: BC Managed Care – PPO

## 2019-08-31 DIAGNOSIS — J309 Allergic rhinitis, unspecified: Secondary | ICD-10-CM

## 2019-09-07 ENCOUNTER — Ambulatory Visit (INDEPENDENT_AMBULATORY_CARE_PROVIDER_SITE_OTHER): Payer: BC Managed Care – PPO

## 2019-09-07 DIAGNOSIS — J309 Allergic rhinitis, unspecified: Secondary | ICD-10-CM | POA: Diagnosis not present

## 2019-09-14 ENCOUNTER — Ambulatory Visit (INDEPENDENT_AMBULATORY_CARE_PROVIDER_SITE_OTHER): Payer: BC Managed Care – PPO

## 2019-09-14 DIAGNOSIS — J309 Allergic rhinitis, unspecified: Secondary | ICD-10-CM | POA: Diagnosis not present

## 2019-09-20 ENCOUNTER — Ambulatory Visit (INDEPENDENT_AMBULATORY_CARE_PROVIDER_SITE_OTHER): Payer: BC Managed Care – PPO

## 2019-09-20 DIAGNOSIS — J309 Allergic rhinitis, unspecified: Secondary | ICD-10-CM | POA: Diagnosis not present

## 2019-09-28 ENCOUNTER — Ambulatory Visit (INDEPENDENT_AMBULATORY_CARE_PROVIDER_SITE_OTHER): Payer: BC Managed Care – PPO

## 2019-09-28 DIAGNOSIS — J309 Allergic rhinitis, unspecified: Secondary | ICD-10-CM

## 2019-10-03 ENCOUNTER — Ambulatory Visit (INDEPENDENT_AMBULATORY_CARE_PROVIDER_SITE_OTHER): Payer: BC Managed Care – PPO

## 2019-10-03 DIAGNOSIS — J309 Allergic rhinitis, unspecified: Secondary | ICD-10-CM | POA: Diagnosis not present

## 2019-10-10 ENCOUNTER — Ambulatory Visit (INDEPENDENT_AMBULATORY_CARE_PROVIDER_SITE_OTHER): Payer: BC Managed Care – PPO

## 2019-10-10 DIAGNOSIS — J309 Allergic rhinitis, unspecified: Secondary | ICD-10-CM | POA: Diagnosis not present

## 2019-10-18 ENCOUNTER — Ambulatory Visit (INDEPENDENT_AMBULATORY_CARE_PROVIDER_SITE_OTHER): Payer: BC Managed Care – PPO

## 2019-10-18 DIAGNOSIS — J309 Allergic rhinitis, unspecified: Secondary | ICD-10-CM

## 2019-10-26 ENCOUNTER — Ambulatory Visit (INDEPENDENT_AMBULATORY_CARE_PROVIDER_SITE_OTHER): Payer: BC Managed Care – PPO

## 2019-10-26 DIAGNOSIS — J309 Allergic rhinitis, unspecified: Secondary | ICD-10-CM | POA: Diagnosis not present

## 2019-10-31 ENCOUNTER — Ambulatory Visit (INDEPENDENT_AMBULATORY_CARE_PROVIDER_SITE_OTHER): Payer: BC Managed Care – PPO

## 2019-10-31 DIAGNOSIS — J309 Allergic rhinitis, unspecified: Secondary | ICD-10-CM | POA: Diagnosis not present

## 2019-11-01 ENCOUNTER — Other Ambulatory Visit: Payer: Self-pay | Admitting: Allergy

## 2019-11-08 ENCOUNTER — Ambulatory Visit (INDEPENDENT_AMBULATORY_CARE_PROVIDER_SITE_OTHER): Payer: BC Managed Care – PPO

## 2019-11-08 DIAGNOSIS — J309 Allergic rhinitis, unspecified: Secondary | ICD-10-CM

## 2019-11-09 ENCOUNTER — Ambulatory Visit: Payer: BC Managed Care – PPO | Admitting: Allergy

## 2019-11-15 ENCOUNTER — Ambulatory Visit (INDEPENDENT_AMBULATORY_CARE_PROVIDER_SITE_OTHER): Payer: BC Managed Care – PPO | Admitting: *Deleted

## 2019-11-15 DIAGNOSIS — J309 Allergic rhinitis, unspecified: Secondary | ICD-10-CM

## 2019-11-22 ENCOUNTER — Other Ambulatory Visit: Payer: Self-pay

## 2019-11-22 ENCOUNTER — Encounter: Payer: Self-pay | Admitting: Allergy

## 2019-11-22 ENCOUNTER — Ambulatory Visit (INDEPENDENT_AMBULATORY_CARE_PROVIDER_SITE_OTHER): Payer: BC Managed Care – PPO | Admitting: Allergy

## 2019-11-22 VITALS — BP 104/80 | HR 67 | Temp 98.0°F | Resp 18 | Wt 170.4 lb

## 2019-11-22 DIAGNOSIS — J3089 Other allergic rhinitis: Secondary | ICD-10-CM

## 2019-11-22 MED ORDER — LEVOCETIRIZINE DIHYDROCHLORIDE 5 MG PO TABS: 5.0000 mg | ORAL_TABLET | Freq: Two times a day (BID) | ORAL | 1 refills | Status: AC

## 2019-11-22 NOTE — Patient Instructions (Signed)
Allergies  - continue avoidance measures for tree pollens, grass pollens, weed pollens, molds, dust mites, cat, dog  - continue allergen immunotherapy (allergy shots per protocol and have access to epinephrine device to have with you on injection days   - continue Levoceterizine 5mg  once a day and may take additional dose if needed.  Make sure to take prior to injections  - use nasal Ipratropium 2 sprays each nostril 3-4 times a day as needed for nasal drainage  - use Flonase 2 sprays each nostril daily for nasal congestion.  Use for 1-2 weeks at a time before stopping once symptoms improve  Follow-up 6-12 months or sooner if needed

## 2019-11-22 NOTE — Progress Notes (Signed)
Follow-up Note  RE: Tracey Floyd MRN: [id number] DOB: 1970-10-31 Date of Office Visit: 11/22/2019   History of present illness: Tracey Floyd is a 49 y.o. female presenting today for follow-up of allergic rhinitis with conjunctivitis.  She was last seen in the office on 05/11/2019 by myself.  She has done well since this timeframe.  She did restart on allergen immunotherapy and is doing well and is almost back at maintenance dosing.  She is tolerating immunotherapy without any large local or systemic reactions.  She states this spring and summer have been actually better seasons for her than the past.  She states usually in April she has an episode where she loses her voice and has increasing nasal symptoms and other allergy symptoms that she did not have this April.  She is taking Xyzal daily and states it is helpful.  She will use either ipratropium or Flonase for either nasal drainage or congestion respectively as she needs to.  She states she did use both of these yesterday as she does state her house.  Review of systems: Review of Systems  Constitutional: Negative.   HENT: Negative.   Eyes: Negative.   Respiratory: Negative.   Cardiovascular: Negative.   Gastrointestinal: Negative.   Musculoskeletal: Negative.   Skin: Negative.   Neurological: Negative.     All other systems negative unless noted above in HPI  Past medical/social/surgical/family history have been reviewed and are unchanged unless specifically indicated below.  No changes  Medication List: Current Outpatient Medications  Medication Sig Dispense Refill  . EPINEPHrine 0.3 mg/0.3 mL IJ SOAJ injection Inject 0.3 mLs (0.3 mg total) into the muscle as needed. 2 each 2  . fluticasone (FLONASE) 50 MCG/ACT nasal spray Place 2 sprays into both nostrils daily. 16 g 5  . ipratropium (ATROVENT) 0.06 % nasal spray Place 2 sprays into both nostrils 3 (three) times daily. 15 mL 5  . levocetirizine (XYZAL) 5 MG  tablet TAKE 1 TABLET BY MOUTH EVERY DAY 90 tablet 1  . Levonorgestrel (MIRENA IU) 1 application by Intrauterine route once.    . Multiple Vitamins-Minerals (MULTIVITAMIN WITH MINERALS) tablet Take 1 tablet by mouth daily.    Marland Kitchen omeprazole (PRILOSEC) 40 MG capsule Take 40 mg by mouth 2 (two) times daily.     No current facility-administered medications for this visit.     Known medication allergies: No Known Allergies   Physical examination: Blood pressure 104/80, pulse 67, temperature 98 F (36.7 C), temperature source Temporal, resp. rate 18, weight 170 lb 6.4 oz (77.3 kg), SpO2 99 %.  General: Alert, interactive, in no acute distress. HEENT: PERRLA, TMs pearly gray, turbinates non-edematous without discharge, post-pharynx non erythematous. Neck: Supple without lymphadenopathy. Lungs: Clear to auscultation without wheezing, rhonchi or rales. {no increased work of breathing. CV: Normal S1, S2 without murmurs. Abdomen: Nondistended, nontender. Skin: Warm and dry, without lesions or rashes. Extremities:  No clubbing, cyanosis or edema. Neuro:   Grossly intact.  Diagnositics/Labs: None today  Assessment and plan: Allergic rhinitis   - continue avoidance measures for tree pollens, grass pollens, weed pollens, molds, dust mites, cat, dog  - continue allergen immunotherapy (allergy shots per protocol and have access to epinephrine device to have with you on injection days   - continue Levoceterizine 5mg  once a day and may take additional dose if needed.  Make sure to take prior to injections  - use nasal Ipratropium 2 sprays each nostril 3-4 times a day as needed  for nasal drainage  - use Flonase 2 sprays each nostril daily for nasal congestion.  Use for 1-2 weeks at a time before stopping once symptoms improve  Follow-up 6-12 months or sooner if needed  I appreciate the opportunity to take part in [name]'s care. Please do not hesitate to contact me with  questions.  Sincerely,   Margo Aye, MD Allergy/Immunology [facility]

## 2019-11-28 ENCOUNTER — Ambulatory Visit (INDEPENDENT_AMBULATORY_CARE_PROVIDER_SITE_OTHER): Payer: BC Managed Care – PPO

## 2019-11-28 DIAGNOSIS — J309 Allergic rhinitis, unspecified: Secondary | ICD-10-CM

## 2019-12-08 ENCOUNTER — Ambulatory Visit (INDEPENDENT_AMBULATORY_CARE_PROVIDER_SITE_OTHER): Payer: BC Managed Care – PPO

## 2019-12-08 DIAGNOSIS — J309 Allergic rhinitis, unspecified: Secondary | ICD-10-CM | POA: Diagnosis not present

## 2019-12-13 ENCOUNTER — Ambulatory Visit (INDEPENDENT_AMBULATORY_CARE_PROVIDER_SITE_OTHER): Payer: BC Managed Care – PPO

## 2019-12-13 DIAGNOSIS — J309 Allergic rhinitis, unspecified: Secondary | ICD-10-CM | POA: Diagnosis not present

## 2019-12-21 ENCOUNTER — Ambulatory Visit (INDEPENDENT_AMBULATORY_CARE_PROVIDER_SITE_OTHER): Payer: BC Managed Care – PPO

## 2019-12-21 DIAGNOSIS — J309 Allergic rhinitis, unspecified: Secondary | ICD-10-CM

## 2019-12-26 NOTE — Progress Notes (Signed)
VIALS EXP 12-26-20 

## 2019-12-28 ENCOUNTER — Ambulatory Visit (INDEPENDENT_AMBULATORY_CARE_PROVIDER_SITE_OTHER): Payer: BC Managed Care – PPO

## 2019-12-28 DIAGNOSIS — J309 Allergic rhinitis, unspecified: Secondary | ICD-10-CM | POA: Diagnosis not present

## 2019-12-29 DIAGNOSIS — J3081 Allergic rhinitis due to animal (cat) (dog) hair and dander: Secondary | ICD-10-CM | POA: Diagnosis not present

## 2020-01-04 ENCOUNTER — Ambulatory Visit (INDEPENDENT_AMBULATORY_CARE_PROVIDER_SITE_OTHER): Payer: BC Managed Care – PPO

## 2020-01-04 DIAGNOSIS — J309 Allergic rhinitis, unspecified: Secondary | ICD-10-CM | POA: Diagnosis not present

## 2020-01-11 ENCOUNTER — Ambulatory Visit (INDEPENDENT_AMBULATORY_CARE_PROVIDER_SITE_OTHER): Payer: BC Managed Care – PPO

## 2020-01-11 DIAGNOSIS — J309 Allergic rhinitis, unspecified: Secondary | ICD-10-CM

## 2020-01-17 ENCOUNTER — Ambulatory Visit (INDEPENDENT_AMBULATORY_CARE_PROVIDER_SITE_OTHER): Payer: BC Managed Care – PPO

## 2020-01-17 DIAGNOSIS — J309 Allergic rhinitis, unspecified: Secondary | ICD-10-CM

## 2020-01-23 ENCOUNTER — Ambulatory Visit (INDEPENDENT_AMBULATORY_CARE_PROVIDER_SITE_OTHER): Payer: BC Managed Care – PPO

## 2020-01-23 DIAGNOSIS — J309 Allergic rhinitis, unspecified: Secondary | ICD-10-CM | POA: Diagnosis not present

## 2020-01-30 ENCOUNTER — Ambulatory Visit (INDEPENDENT_AMBULATORY_CARE_PROVIDER_SITE_OTHER): Payer: BC Managed Care – PPO

## 2020-01-30 DIAGNOSIS — J309 Allergic rhinitis, unspecified: Secondary | ICD-10-CM

## 2020-02-07 ENCOUNTER — Ambulatory Visit (INDEPENDENT_AMBULATORY_CARE_PROVIDER_SITE_OTHER): Payer: BC Managed Care – PPO

## 2020-02-07 DIAGNOSIS — J309 Allergic rhinitis, unspecified: Secondary | ICD-10-CM

## 2020-02-13 ENCOUNTER — Ambulatory Visit (INDEPENDENT_AMBULATORY_CARE_PROVIDER_SITE_OTHER): Payer: BC Managed Care – PPO | Admitting: *Deleted

## 2020-02-13 DIAGNOSIS — J309 Allergic rhinitis, unspecified: Secondary | ICD-10-CM | POA: Diagnosis not present

## 2020-02-21 ENCOUNTER — Ambulatory Visit (INDEPENDENT_AMBULATORY_CARE_PROVIDER_SITE_OTHER): Payer: BC Managed Care – PPO

## 2020-02-21 DIAGNOSIS — J309 Allergic rhinitis, unspecified: Secondary | ICD-10-CM

## 2020-02-27 ENCOUNTER — Ambulatory Visit (INDEPENDENT_AMBULATORY_CARE_PROVIDER_SITE_OTHER): Payer: BC Managed Care – PPO | Admitting: *Deleted

## 2020-02-27 DIAGNOSIS — J309 Allergic rhinitis, unspecified: Secondary | ICD-10-CM | POA: Diagnosis not present

## 2020-03-08 ENCOUNTER — Ambulatory Visit (INDEPENDENT_AMBULATORY_CARE_PROVIDER_SITE_OTHER): Payer: BC Managed Care – PPO | Admitting: *Deleted

## 2020-03-08 DIAGNOSIS — J309 Allergic rhinitis, unspecified: Secondary | ICD-10-CM

## 2020-03-15 ENCOUNTER — Ambulatory Visit (INDEPENDENT_AMBULATORY_CARE_PROVIDER_SITE_OTHER): Payer: BC Managed Care – PPO

## 2020-03-15 DIAGNOSIS — J309 Allergic rhinitis, unspecified: Secondary | ICD-10-CM

## 2020-03-20 ENCOUNTER — Ambulatory Visit (INDEPENDENT_AMBULATORY_CARE_PROVIDER_SITE_OTHER): Payer: BC Managed Care – PPO | Admitting: *Deleted

## 2020-03-20 DIAGNOSIS — J309 Allergic rhinitis, unspecified: Secondary | ICD-10-CM

## 2020-03-26 ENCOUNTER — Other Ambulatory Visit: Payer: Self-pay | Admitting: Family Medicine

## 2020-03-26 ENCOUNTER — Ambulatory Visit (INDEPENDENT_AMBULATORY_CARE_PROVIDER_SITE_OTHER): Payer: BC Managed Care – PPO

## 2020-03-26 DIAGNOSIS — Z1231 Encounter for screening mammogram for malignant neoplasm of breast: Secondary | ICD-10-CM

## 2020-03-26 DIAGNOSIS — J309 Allergic rhinitis, unspecified: Secondary | ICD-10-CM

## 2020-04-04 ENCOUNTER — Ambulatory Visit (INDEPENDENT_AMBULATORY_CARE_PROVIDER_SITE_OTHER): Payer: BC Managed Care – PPO

## 2020-04-04 DIAGNOSIS — J309 Allergic rhinitis, unspecified: Secondary | ICD-10-CM | POA: Diagnosis not present

## 2020-04-09 ENCOUNTER — Ambulatory Visit (INDEPENDENT_AMBULATORY_CARE_PROVIDER_SITE_OTHER): Payer: BC Managed Care – PPO | Admitting: *Deleted

## 2020-04-09 DIAGNOSIS — J309 Allergic rhinitis, unspecified: Secondary | ICD-10-CM

## 2020-04-16 DIAGNOSIS — J3081 Allergic rhinitis due to animal (cat) (dog) hair and dander: Secondary | ICD-10-CM | POA: Diagnosis not present

## 2020-04-16 NOTE — Progress Notes (Signed)
Vials exp 04-16-21 

## 2020-04-19 ENCOUNTER — Ambulatory Visit (INDEPENDENT_AMBULATORY_CARE_PROVIDER_SITE_OTHER): Payer: BC Managed Care – PPO

## 2020-04-19 DIAGNOSIS — J309 Allergic rhinitis, unspecified: Secondary | ICD-10-CM

## 2020-04-24 ENCOUNTER — Ambulatory Visit (INDEPENDENT_AMBULATORY_CARE_PROVIDER_SITE_OTHER): Payer: BC Managed Care – PPO | Admitting: *Deleted

## 2020-04-24 DIAGNOSIS — J309 Allergic rhinitis, unspecified: Secondary | ICD-10-CM | POA: Diagnosis not present

## 2020-05-02 ENCOUNTER — Ambulatory Visit: Payer: BC Managed Care – PPO

## 2020-05-02 ENCOUNTER — Ambulatory Visit (INDEPENDENT_AMBULATORY_CARE_PROVIDER_SITE_OTHER): Payer: BC Managed Care – PPO

## 2020-05-02 DIAGNOSIS — J309 Allergic rhinitis, unspecified: Secondary | ICD-10-CM | POA: Diagnosis not present

## 2020-05-09 ENCOUNTER — Ambulatory Visit (INDEPENDENT_AMBULATORY_CARE_PROVIDER_SITE_OTHER): Payer: BC Managed Care – PPO

## 2020-05-09 DIAGNOSIS — J309 Allergic rhinitis, unspecified: Secondary | ICD-10-CM

## 2020-05-16 ENCOUNTER — Ambulatory Visit (INDEPENDENT_AMBULATORY_CARE_PROVIDER_SITE_OTHER): Payer: BC Managed Care – PPO

## 2020-05-16 DIAGNOSIS — J309 Allergic rhinitis, unspecified: Secondary | ICD-10-CM

## 2020-05-22 ENCOUNTER — Ambulatory Visit (INDEPENDENT_AMBULATORY_CARE_PROVIDER_SITE_OTHER): Payer: BC Managed Care – PPO

## 2020-05-22 DIAGNOSIS — J309 Allergic rhinitis, unspecified: Secondary | ICD-10-CM | POA: Diagnosis not present

## 2020-05-23 ENCOUNTER — Ambulatory Visit: Payer: BC Managed Care – PPO | Admitting: Allergy

## 2020-05-29 ENCOUNTER — Ambulatory Visit (INDEPENDENT_AMBULATORY_CARE_PROVIDER_SITE_OTHER): Payer: BC Managed Care – PPO

## 2020-05-29 DIAGNOSIS — J309 Allergic rhinitis, unspecified: Secondary | ICD-10-CM | POA: Diagnosis not present

## 2020-06-04 ENCOUNTER — Ambulatory Visit (INDEPENDENT_AMBULATORY_CARE_PROVIDER_SITE_OTHER): Payer: BC Managed Care – PPO

## 2020-06-04 DIAGNOSIS — J309 Allergic rhinitis, unspecified: Secondary | ICD-10-CM

## 2020-06-12 ENCOUNTER — Ambulatory Visit (INDEPENDENT_AMBULATORY_CARE_PROVIDER_SITE_OTHER): Payer: BC Managed Care – PPO | Admitting: *Deleted

## 2020-06-12 ENCOUNTER — Ambulatory Visit: Payer: BC Managed Care – PPO

## 2020-06-12 DIAGNOSIS — J309 Allergic rhinitis, unspecified: Secondary | ICD-10-CM | POA: Diagnosis not present

## 2020-06-18 ENCOUNTER — Ambulatory Visit (INDEPENDENT_AMBULATORY_CARE_PROVIDER_SITE_OTHER): Payer: BC Managed Care – PPO

## 2020-06-18 DIAGNOSIS — J309 Allergic rhinitis, unspecified: Secondary | ICD-10-CM

## 2020-06-21 ENCOUNTER — Ambulatory Visit: Payer: BC Managed Care – PPO | Admitting: Allergy

## 2020-06-28 ENCOUNTER — Other Ambulatory Visit: Payer: Self-pay

## 2020-06-28 ENCOUNTER — Ambulatory Visit: Payer: Self-pay

## 2020-06-28 ENCOUNTER — Ambulatory Visit (INDEPENDENT_AMBULATORY_CARE_PROVIDER_SITE_OTHER): Payer: BC Managed Care – PPO | Admitting: Allergy

## 2020-06-28 ENCOUNTER — Encounter: Payer: Self-pay | Admitting: Allergy

## 2020-06-28 VITALS — BP 118/76 | HR 99 | Temp 98.0°F | Resp 16 | Wt 177.0 lb

## 2020-06-28 DIAGNOSIS — J309 Allergic rhinitis, unspecified: Secondary | ICD-10-CM

## 2020-06-28 DIAGNOSIS — J3089 Other allergic rhinitis: Secondary | ICD-10-CM

## 2020-06-28 NOTE — Progress Notes (Signed)
Follow-up Note  RE: Tracey Floyd MRN: [id number] DOB: [birth year] Date of Office Visit: 06/28/2020   History of present illness: Tracey Floyd is a 50 y.o. female presenting today for follow-up of allergic rhinitis.  She was last seen in the office on 11/22/2019 by myself.  She is doing well since her last visit without any major health changes, surgeries or hospitalizations.  She is on allergen immunotherapy and is in the red vial.  She is tolerating injections well without any large local or systemic reactions.  She can tell a difference in the allergy shots and her symptom control.  She states she does continue to take the levocetirizine daily.  She uses the nasal ipratropium and/or Flonase as needed.  She states she last used either of these in the spring.     Review of systems: Review of Systems  Constitutional: Negative.   HENT: Negative.   Eyes: Negative.   Respiratory: Negative.   Cardiovascular: Negative.   Gastrointestinal: Negative.   Musculoskeletal: Negative.   Skin: Negative.   Neurological: Negative.     All other systems negative unless noted above in HPI  Past medical/social/surgical/family history have been reviewed and are unchanged unless specifically indicated below.  No changes  Medication List: Current Outpatient Medications  Medication Sig Dispense Refill  . EPINEPHrine 0.3 mg/0.3 mL IJ SOAJ injection Inject 0.3 mLs (0.3 mg total) into the muscle as needed. 2 each 2  . fluticasone (FLONASE) 50 MCG/ACT nasal spray Place 2 sprays into both nostrils daily. 16 g 5  . ipratropium (ATROVENT) 0.06 % nasal spray Place 2 sprays into both nostrils 3 (three) times daily. 15 mL 5  . levocetirizine (XYZAL) 5 MG tablet Take 1 tablet (5 mg total) by mouth 2 (two) times daily. 180 tablet 1  . Levonorgestrel (MIRENA IU) 1 application by Intrauterine route once.    . Multiple Vitamins-Minerals (MULTIVITAMIN WITH MINERALS) tablet Take 1 tablet by mouth daily.     Marland Kitchen omeprazole (PRILOSEC) 40 MG capsule Take 40 mg by mouth 2 (two) times daily.     No current facility-administered medications for this visit.     Known medication allergies: No Known Allergies   Physical examination: Blood pressure 118/76, pulse 99, temperature 98 F (36.7 C), temperature source Temporal, resp. rate 16, weight 177 lb (80.3 kg), SpO2 100 %.  General: Alert, interactive, in no acute distress. HEENT: TMs pearly gray, turbinates non-edematous without discharge, post-pharynx non erythematous. Neck: Supple without lymphadenopathy. Lungs: Clear to auscultation without wheezing, rhonchi or rales. {no increased work of breathing. CV: Normal S1, S2 without murmurs. Abdomen: Nondistended, nontender. Skin: Warm and dry, without lesions or rashes. Extremities:  No clubbing, cyanosis or edema. Neuro:   Grossly intact.  Diagnositics/Labs: None today  Assessment and plan:   Allergic rhinitis  - continue avoidance measures for tree pollens, grass pollens, weed pollens, molds, dust mites, cat, dog  - continue allergen immunotherapy (allergy shots per protocol and have access to epinephrine device to have with you on injection days)  - continue Levoceterizine 5mg  once a day as needed and may take additional dose if needed.  Make sure to take prior to injections.  - use nasal Ipratropium 2 sprays each nostril 3-4 times a day as needed for nasal drainage  - use Flonase 2 sprays each nostril daily for nasal congestion.  Use for 1-2 weeks at a time before stopping once symptoms improve  Follow-up 12 months or sooner if needed  I  appreciate the opportunity to take part in Tracey Floyd's care. Please do not hesitate to contact me with questions.  Sincerely,   Margo Aye, MD Allergy/Immunology [facility]

## 2020-06-28 NOTE — Patient Instructions (Signed)
Allergic rhinitis  - continue avoidance measures for tree pollens, grass pollens, weed pollens, molds, dust mites, cat, dog  - continue allergen immunotherapy (allergy shots per protocol and have access to epinephrine device to have with you on injection days)  - continue Levoceterizine 5mg  once a day as needed and may take additional dose if needed.  Make sure to take prior to injections.  - use nasal Ipratropium 2 sprays each nostril 3-4 times a day as needed for nasal drainage  - use Flonase 2 sprays each nostril daily for nasal congestion.  Use for 1-2 weeks at a time before stopping once symptoms improve  Follow-up 12 months or sooner if needed

## 2020-07-04 ENCOUNTER — Ambulatory Visit (INDEPENDENT_AMBULATORY_CARE_PROVIDER_SITE_OTHER): Payer: BC Managed Care – PPO

## 2020-07-04 DIAGNOSIS — J309 Allergic rhinitis, unspecified: Secondary | ICD-10-CM

## 2020-07-10 DIAGNOSIS — J3081 Allergic rhinitis due to animal (cat) (dog) hair and dander: Secondary | ICD-10-CM | POA: Diagnosis not present

## 2020-07-10 NOTE — Progress Notes (Signed)
VIALS EXP 07-10-21 

## 2020-07-11 ENCOUNTER — Ambulatory Visit (INDEPENDENT_AMBULATORY_CARE_PROVIDER_SITE_OTHER): Payer: BC Managed Care – PPO

## 2020-07-11 DIAGNOSIS — J309 Allergic rhinitis, unspecified: Secondary | ICD-10-CM

## 2020-07-18 ENCOUNTER — Ambulatory Visit (INDEPENDENT_AMBULATORY_CARE_PROVIDER_SITE_OTHER): Payer: BC Managed Care – PPO

## 2020-07-18 DIAGNOSIS — J309 Allergic rhinitis, unspecified: Secondary | ICD-10-CM

## 2020-07-25 ENCOUNTER — Ambulatory Visit
Admission: RE | Admit: 2020-07-25 | Discharge: 2020-07-25 | Disposition: A | Discharge status: Home / Self Care | Payer: BC Managed Care – PPO | Source: Ambulatory Visit | Attending: Family Medicine | Admitting: Family Medicine

## 2020-07-25 ENCOUNTER — Other Ambulatory Visit: Payer: Self-pay

## 2020-07-25 ENCOUNTER — Ambulatory Visit (INDEPENDENT_AMBULATORY_CARE_PROVIDER_SITE_OTHER): Payer: BC Managed Care – PPO

## 2020-07-25 DIAGNOSIS — Z1231 Encounter for screening mammogram for malignant neoplasm of breast: Secondary | ICD-10-CM

## 2020-07-25 DIAGNOSIS — J309 Allergic rhinitis, unspecified: Secondary | ICD-10-CM

## 2020-07-31 ENCOUNTER — Ambulatory Visit (INDEPENDENT_AMBULATORY_CARE_PROVIDER_SITE_OTHER): Payer: BC Managed Care – PPO | Admitting: *Deleted

## 2020-07-31 DIAGNOSIS — J309 Allergic rhinitis, unspecified: Secondary | ICD-10-CM | POA: Diagnosis not present

## 2020-08-08 ENCOUNTER — Ambulatory Visit (INDEPENDENT_AMBULATORY_CARE_PROVIDER_SITE_OTHER): Payer: BC Managed Care – PPO

## 2020-08-08 DIAGNOSIS — J309 Allergic rhinitis, unspecified: Secondary | ICD-10-CM

## 2020-08-15 ENCOUNTER — Ambulatory Visit (INDEPENDENT_AMBULATORY_CARE_PROVIDER_SITE_OTHER): Payer: BC Managed Care – PPO | Admitting: *Deleted

## 2020-08-15 DIAGNOSIS — J309 Allergic rhinitis, unspecified: Secondary | ICD-10-CM

## 2020-08-22 ENCOUNTER — Ambulatory Visit (INDEPENDENT_AMBULATORY_CARE_PROVIDER_SITE_OTHER): Payer: BC Managed Care – PPO | Admitting: *Deleted

## 2020-08-22 DIAGNOSIS — J309 Allergic rhinitis, unspecified: Secondary | ICD-10-CM | POA: Diagnosis not present

## 2020-09-05 ENCOUNTER — Ambulatory Visit (INDEPENDENT_AMBULATORY_CARE_PROVIDER_SITE_OTHER): Payer: BC Managed Care – PPO | Admitting: *Deleted

## 2020-09-05 DIAGNOSIS — J309 Allergic rhinitis, unspecified: Secondary | ICD-10-CM | POA: Diagnosis not present

## 2020-09-17 ENCOUNTER — Ambulatory Visit (INDEPENDENT_AMBULATORY_CARE_PROVIDER_SITE_OTHER): Payer: BC Managed Care – PPO | Admitting: *Deleted

## 2020-09-17 DIAGNOSIS — J309 Allergic rhinitis, unspecified: Secondary | ICD-10-CM | POA: Diagnosis not present

## 2020-09-24 DIAGNOSIS — J3081 Allergic rhinitis due to animal (cat) (dog) hair and dander: Secondary | ICD-10-CM | POA: Diagnosis not present

## 2020-09-24 NOTE — Progress Notes (Signed)
VIALS EXP 09-24-21 

## 2020-09-30 ENCOUNTER — Ambulatory Visit (INDEPENDENT_AMBULATORY_CARE_PROVIDER_SITE_OTHER): Payer: BC Managed Care – PPO

## 2020-09-30 DIAGNOSIS — J309 Allergic rhinitis, unspecified: Secondary | ICD-10-CM | POA: Diagnosis not present

## 2020-10-15 ENCOUNTER — Ambulatory Visit (INDEPENDENT_AMBULATORY_CARE_PROVIDER_SITE_OTHER): Payer: BC Managed Care – PPO | Admitting: *Deleted

## 2020-10-15 DIAGNOSIS — J309 Allergic rhinitis, unspecified: Secondary | ICD-10-CM

## 2020-11-01 ENCOUNTER — Ambulatory Visit (INDEPENDENT_AMBULATORY_CARE_PROVIDER_SITE_OTHER): Payer: BC Managed Care – PPO

## 2020-11-01 DIAGNOSIS — J309 Allergic rhinitis, unspecified: Secondary | ICD-10-CM

## 2020-11-14 ENCOUNTER — Ambulatory Visit (INDEPENDENT_AMBULATORY_CARE_PROVIDER_SITE_OTHER): Payer: BC Managed Care – PPO | Admitting: *Deleted

## 2020-11-14 DIAGNOSIS — J309 Allergic rhinitis, unspecified: Secondary | ICD-10-CM

## 2020-11-28 ENCOUNTER — Ambulatory Visit (INDEPENDENT_AMBULATORY_CARE_PROVIDER_SITE_OTHER): Payer: BC Managed Care – PPO

## 2020-11-28 DIAGNOSIS — J309 Allergic rhinitis, unspecified: Secondary | ICD-10-CM | POA: Diagnosis not present

## 2020-12-05 ENCOUNTER — Ambulatory Visit (INDEPENDENT_AMBULATORY_CARE_PROVIDER_SITE_OTHER): Payer: BC Managed Care – PPO | Admitting: *Deleted

## 2020-12-05 DIAGNOSIS — J309 Allergic rhinitis, unspecified: Secondary | ICD-10-CM

## 2020-12-12 ENCOUNTER — Ambulatory Visit (INDEPENDENT_AMBULATORY_CARE_PROVIDER_SITE_OTHER): Payer: BC Managed Care – PPO | Admitting: *Deleted

## 2020-12-12 DIAGNOSIS — J309 Allergic rhinitis, unspecified: Secondary | ICD-10-CM

## 2020-12-19 ENCOUNTER — Ambulatory Visit (INDEPENDENT_AMBULATORY_CARE_PROVIDER_SITE_OTHER): Payer: BC Managed Care – PPO | Admitting: *Deleted

## 2020-12-19 DIAGNOSIS — J309 Allergic rhinitis, unspecified: Secondary | ICD-10-CM | POA: Diagnosis not present

## 2020-12-26 ENCOUNTER — Ambulatory Visit (INDEPENDENT_AMBULATORY_CARE_PROVIDER_SITE_OTHER): Payer: BC Managed Care – PPO | Admitting: *Deleted

## 2020-12-26 DIAGNOSIS — J309 Allergic rhinitis, unspecified: Secondary | ICD-10-CM | POA: Diagnosis not present

## 2021-01-03 ENCOUNTER — Ambulatory Visit (INDEPENDENT_AMBULATORY_CARE_PROVIDER_SITE_OTHER): Payer: BC Managed Care – PPO

## 2021-01-03 DIAGNOSIS — J309 Allergic rhinitis, unspecified: Secondary | ICD-10-CM | POA: Diagnosis not present

## 2021-01-03 DIAGNOSIS — E782 Mixed hyperlipidemia: Secondary | ICD-10-CM | POA: Diagnosis not present

## 2021-01-03 DIAGNOSIS — Z124 Encounter for screening for malignant neoplasm of cervix: Secondary | ICD-10-CM | POA: Diagnosis not present

## 2021-01-03 DIAGNOSIS — Z0000 Encounter for general adult medical examination without abnormal findings: Secondary | ICD-10-CM | POA: Diagnosis not present

## 2021-01-15 ENCOUNTER — Ambulatory Visit (INDEPENDENT_AMBULATORY_CARE_PROVIDER_SITE_OTHER): Payer: BC Managed Care – PPO

## 2021-01-15 DIAGNOSIS — J309 Allergic rhinitis, unspecified: Secondary | ICD-10-CM | POA: Diagnosis not present

## 2021-01-29 ENCOUNTER — Ambulatory Visit (INDEPENDENT_AMBULATORY_CARE_PROVIDER_SITE_OTHER): Payer: BC Managed Care – PPO

## 2021-01-29 DIAGNOSIS — J309 Allergic rhinitis, unspecified: Secondary | ICD-10-CM

## 2021-02-10 DIAGNOSIS — J3081 Allergic rhinitis due to animal (cat) (dog) hair and dander: Secondary | ICD-10-CM | POA: Diagnosis not present

## 2021-02-10 NOTE — Progress Notes (Signed)
VIALS MADE. EXP 02-10-22 

## 2021-02-12 ENCOUNTER — Ambulatory Visit (INDEPENDENT_AMBULATORY_CARE_PROVIDER_SITE_OTHER): Payer: BC Managed Care – PPO

## 2021-02-12 DIAGNOSIS — J309 Allergic rhinitis, unspecified: Secondary | ICD-10-CM

## 2021-02-18 ENCOUNTER — Ambulatory Visit: Payer: Self-pay | Admitting: Allergy and Immunology

## 2021-02-25 NOTE — Patient Instructions (Addendum)
Allergic rhinitis  - continue avoidance measures for tree pollens, grass pollens, weed pollens, molds, dust mites, cat, dog  - continue allergy injections and have access to your epinephrine auto-injector  - continue  Xyzal (levoceterizine) 5mg  once a day as needed and may take additional dose if needed.  Make sure to take prior to your allergy injections.  - Continue nasal Ipratropium 2 sprays each nostril 3-4 times a day as needed for nasal drainage  - Continue Flonase (fluticasone) 2 sprays each nostril daily for nasal congestion.   Asthma Continue Advair 250/50 1 puff twice a day to help prevent cough and wheeze Continue Singulair (montelukast) 10 mg once a day to help prevent cough and wheeze Start albuterol 2 puffs every 4-6 hours as needed for cough, wheeze, tightness in chest, or shortness of breath  Follow-up 3 months or sooner if needed

## 2021-02-26 ENCOUNTER — Other Ambulatory Visit: Payer: Self-pay

## 2021-02-26 ENCOUNTER — Encounter: Payer: Self-pay | Admitting: Family

## 2021-02-26 ENCOUNTER — Ambulatory Visit: Payer: Self-pay | Admitting: *Deleted

## 2021-02-26 ENCOUNTER — Ambulatory Visit (INDEPENDENT_AMBULATORY_CARE_PROVIDER_SITE_OTHER): Payer: BC Managed Care – PPO | Admitting: Family

## 2021-02-26 VITALS — BP 118/72 | HR 76 | Temp 97.9°F | Resp 18 | Ht 63.0 in | Wt 185.2 lb

## 2021-02-26 DIAGNOSIS — J309 Allergic rhinitis, unspecified: Secondary | ICD-10-CM

## 2021-02-26 DIAGNOSIS — J3089 Other allergic rhinitis: Secondary | ICD-10-CM

## 2021-02-26 DIAGNOSIS — J4530 Mild persistent asthma, uncomplicated: Secondary | ICD-10-CM

## 2021-02-26 MED ORDER — ADVAIR DISKUS 250-50 MCG/ACT IN AEPB: 1.0000 | INHALATION_SPRAY | Freq: Two times a day (BID) | RESPIRATORY_TRACT | 5 refills | Status: DC

## 2021-02-26 MED ORDER — ALBUTEROL SULFATE HFA 108 (90 BASE) MCG/ACT IN AERS: 2.0000 | INHALATION_SPRAY | RESPIRATORY_TRACT | 2 refills | Status: DC | PRN

## 2021-02-26 NOTE — Progress Notes (Signed)
[address] [place]a Presto [place] [address] Dept: [phone]  FOLLOW UP NOTE  Patient ID: ALYDIA GOSSER, female    DOB: August 14, 1970  Age: 50 y.o. MRN: [id number] Date of Office Visit: 02/26/2021  Assessment  Chief Complaint: Asthma  HPI ALEJANDRINA RAIMER is a 50 year old female who presents today for follow-up of allergic rhinitis.  She was last seen on June 28, 2020 by Dr. Delorse Lek.  Since her last office visit.  She reports that in August she saw her primary care physician and was wheezing at the time.  She was started on Advair 250/50 1 puff twice a day and Singulair 10 mg once a day.  She reports that she had been having a problem with coughing also.  Since starting the Advair 250/50 1 puff twice a day and Singulair 10 mg once a day she denies any coughing, wheezing, tightness in her chest, shortness of breath, and nocturnal awakenings due to breathing problems.  She has not made any trips to the emergency room or urgent care due to breathing problems and has not required any systemic steroids.  She does not have an albuterol inhaler.  She does not have a known history of asthma but reports an March 2020 she had a really bad cough and could not be seen in person due to concerns for COVID-19.  She did have a televisit where she was given a prescription for albuterol, Advair, and a reflux medication.  Eventually the cough got better and she stopped all these medications.  Also during that time she stopped her allergy injections.  She feels like this was maybe asthma then.  So reports that she has seen GI since and reflux has been ruled out for the cause of her cough.  Allergic rhinitis is reported as controlled with levocetirizine 5 mg at night, ipratropium bromide nasal spray as needed, fluticasone nasal spray as needed and allergy injections per protocol.  She does feel like her allergy injections help and she denies any large local reactions.  Her EpiPen is up-to-date.  She denies  any rhinorrhea, nasal congestion, and postnasal drip.   Drug Allergies:  No Known Allergies  Review of Systems: Review of Systems  Constitutional:  Negative for chills and fever.  HENT:         Denies rhinorrhea, nasal congestion, and post nasal drip  Eyes:        Denies itchy watery eyes  Respiratory:  Negative for cough, shortness of breath and wheezing.   Cardiovascular:  Negative for chest pain and palpitations.  Gastrointestinal:  Negative for heartburn.       Denies heartburn and reflux symptoms  Genitourinary:  Negative for frequency.  Skin:  Negative for itching and rash.  Neurological:  Negative for headaches.  Endo/Heme/Allergies:  Positive for environmental allergies.    Physical Exam: BP 118/72   Pulse 76   Temp 97.9 F (36.6 C) (Temporal)   Resp 18   Ht 5\' 3"  (1.6 m)   Wt 185 lb 4 oz (84 kg)   SpO2 97%   BMI 32.82 kg/m    Physical Exam Constitutional:      Appearance: Normal appearance.  HENT:     Head: Normocephalic and atraumatic.     Comments: Pharynx normal, eyes normal, ears normal, nose normal    Right Ear: Tympanic membrane, ear canal and external ear normal.     Left Ear: Tympanic membrane, ear canal and external ear normal.     Nose: Nose  normal.     Mouth/Throat:     Mouth: Mucous membranes are moist.     Pharynx: Oropharynx is clear.  Eyes:     Conjunctiva/sclera: Conjunctivae normal.  Cardiovascular:     Rate and Rhythm: Regular rhythm.     Heart sounds: Normal heart sounds.  Pulmonary:     Effort: Pulmonary effort is normal.     Breath sounds: Normal breath sounds.     Comments: Lungs clear to auscultation Musculoskeletal:     Cervical back: Neck supple.  Skin:    General: Skin is warm.  Neurological:     Mental Status: She is alert and oriented to person, place, and time.  Psychiatric:        Mood and Affect: Mood normal.        Behavior: Behavior normal.        Thought Content: Thought content normal.        Judgment:  Judgment normal.    Diagnostics: FVC 3.08 L, FEV1 2.51 L.  Predicted FVC 3.42 L, predicted FEV1 2.71 L.  Spirometry indicates normal ventilatory function.  Assessment and Plan: 1. Mild persistent asthma without complication   2. Non-seasonal allergic rhinitis due to other allergic trigger     Meds ordered this encounter  Medications   albuterol (VENTOLIN HFA) 108 (90 Base) MCG/ACT inhaler    Sig: Inhale 2 puffs into the lungs every 4 (four) hours as needed for wheezing or shortness of breath.    Dispense:  18 g    Refill:  2   ADVAIR DISKUS 250-50 MCG/ACT AEPB    Sig: Inhale 1 puff into the lungs 2 (two) times daily.    Dispense:  60 each    Refill:  5     Patient Instructions  Allergic rhinitis  - continue avoidance measures for tree pollens, grass pollens, weed pollens, molds, dust mites, cat, dog  - continue allergy injections and have access to your epinephrine auto-injector  - continue  Xyzal (levoceterizine) 5mg  once a day as needed and may take additional dose if needed.  Make sure to take prior to your allergy injections.  - Continue nasal Ipratropium 2 sprays each nostril 3-4 times a day as needed for nasal drainage  - Continue Flonase (fluticasone) 2 sprays each nostril daily for nasal congestion.   Asthma Continue Advair 250/50 1 puff twice a day to help prevent cough and wheeze Continue Singulair (montelukast) 10 mg once a day to help prevent cough and wheeze Start albuterol 2 puffs every 4-6 hours as needed for cough, wheeze, tightness in chest, or shortness of breath  Follow-up 3 months or sooner if needed  Return in about 3 months (around 05/28/2021), or if symptoms worsen or fail to improve.    Thank you for the opportunity to care for this patient.  Please do not hesitate to contact me with questions.  05/30/2021, FNP [facility]

## 2021-03-07 ENCOUNTER — Ambulatory Visit (INDEPENDENT_AMBULATORY_CARE_PROVIDER_SITE_OTHER): Payer: BC Managed Care – PPO

## 2021-03-07 DIAGNOSIS — J309 Allergic rhinitis, unspecified: Secondary | ICD-10-CM

## 2021-03-20 ENCOUNTER — Ambulatory Visit (INDEPENDENT_AMBULATORY_CARE_PROVIDER_SITE_OTHER): Payer: BC Managed Care – PPO | Admitting: *Deleted

## 2021-03-20 DIAGNOSIS — J309 Allergic rhinitis, unspecified: Secondary | ICD-10-CM

## 2021-03-27 ENCOUNTER — Ambulatory Visit (INDEPENDENT_AMBULATORY_CARE_PROVIDER_SITE_OTHER): Payer: BC Managed Care – PPO | Admitting: *Deleted

## 2021-03-27 DIAGNOSIS — J309 Allergic rhinitis, unspecified: Secondary | ICD-10-CM | POA: Diagnosis not present

## 2021-04-03 ENCOUNTER — Ambulatory Visit (INDEPENDENT_AMBULATORY_CARE_PROVIDER_SITE_OTHER): Payer: BC Managed Care – PPO | Admitting: *Deleted

## 2021-04-03 DIAGNOSIS — J309 Allergic rhinitis, unspecified: Secondary | ICD-10-CM | POA: Diagnosis not present

## 2021-04-09 ENCOUNTER — Ambulatory Visit (INDEPENDENT_AMBULATORY_CARE_PROVIDER_SITE_OTHER): Payer: BC Managed Care – PPO

## 2021-04-09 DIAGNOSIS — J309 Allergic rhinitis, unspecified: Secondary | ICD-10-CM

## 2021-04-18 ENCOUNTER — Ambulatory Visit (INDEPENDENT_AMBULATORY_CARE_PROVIDER_SITE_OTHER): Payer: BC Managed Care – PPO

## 2021-04-18 DIAGNOSIS — J309 Allergic rhinitis, unspecified: Secondary | ICD-10-CM | POA: Diagnosis not present

## 2021-05-02 ENCOUNTER — Ambulatory Visit (INDEPENDENT_AMBULATORY_CARE_PROVIDER_SITE_OTHER): Payer: BC Managed Care – PPO

## 2021-05-02 DIAGNOSIS — J309 Allergic rhinitis, unspecified: Secondary | ICD-10-CM | POA: Diagnosis not present

## 2021-05-12 ENCOUNTER — Ambulatory Visit (INDEPENDENT_AMBULATORY_CARE_PROVIDER_SITE_OTHER): Payer: BC Managed Care – PPO

## 2021-05-12 DIAGNOSIS — J309 Allergic rhinitis, unspecified: Secondary | ICD-10-CM

## 2021-05-21 ENCOUNTER — Ambulatory Visit (INDEPENDENT_AMBULATORY_CARE_PROVIDER_SITE_OTHER): Payer: BC Managed Care – PPO

## 2021-05-21 DIAGNOSIS — J309 Allergic rhinitis, unspecified: Secondary | ICD-10-CM | POA: Diagnosis not present

## 2021-05-27 NOTE — Progress Notes (Signed)
VIALS MADE. EXP 05-27-22 °

## 2021-05-28 DIAGNOSIS — J3081 Allergic rhinitis due to animal (cat) (dog) hair and dander: Secondary | ICD-10-CM | POA: Diagnosis not present

## 2021-06-12 ENCOUNTER — Ambulatory Visit (INDEPENDENT_AMBULATORY_CARE_PROVIDER_SITE_OTHER): Payer: BC Managed Care – PPO

## 2021-06-12 DIAGNOSIS — J309 Allergic rhinitis, unspecified: Secondary | ICD-10-CM

## 2021-06-27 ENCOUNTER — Ambulatory Visit (INDEPENDENT_AMBULATORY_CARE_PROVIDER_SITE_OTHER): Payer: BC Managed Care – PPO

## 2021-06-27 DIAGNOSIS — J309 Allergic rhinitis, unspecified: Secondary | ICD-10-CM | POA: Diagnosis not present

## 2021-07-01 ENCOUNTER — Other Ambulatory Visit: Payer: Self-pay | Admitting: Family Medicine

## 2021-07-01 DIAGNOSIS — Z1231 Encounter for screening mammogram for malignant neoplasm of breast: Secondary | ICD-10-CM

## 2021-07-03 DIAGNOSIS — F329 Major depressive disorder, single episode, unspecified: Secondary | ICD-10-CM | POA: Diagnosis not present

## 2021-07-03 DIAGNOSIS — F411 Generalized anxiety disorder: Secondary | ICD-10-CM | POA: Diagnosis not present

## 2021-07-04 ENCOUNTER — Ambulatory Visit: Payer: BC Managed Care – PPO | Admitting: Allergy

## 2021-07-10 ENCOUNTER — Ambulatory Visit (INDEPENDENT_AMBULATORY_CARE_PROVIDER_SITE_OTHER): Payer: BC Managed Care – PPO

## 2021-07-10 DIAGNOSIS — J309 Allergic rhinitis, unspecified: Secondary | ICD-10-CM

## 2021-07-24 ENCOUNTER — Ambulatory Visit (INDEPENDENT_AMBULATORY_CARE_PROVIDER_SITE_OTHER): Payer: BC Managed Care – PPO | Admitting: *Deleted

## 2021-07-24 DIAGNOSIS — J309 Allergic rhinitis, unspecified: Secondary | ICD-10-CM | POA: Diagnosis not present

## 2021-07-28 ENCOUNTER — Ambulatory Visit
Admission: RE | Admit: 2021-07-28 | Discharge: 2021-07-28 | Disposition: A | Discharge status: Home / Self Care | Payer: BC Managed Care – PPO | Source: Ambulatory Visit | Attending: Family Medicine | Admitting: Family Medicine

## 2021-07-28 DIAGNOSIS — Z1231 Encounter for screening mammogram for malignant neoplasm of breast: Secondary | ICD-10-CM | POA: Diagnosis not present

## 2021-07-31 ENCOUNTER — Ambulatory Visit (INDEPENDENT_AMBULATORY_CARE_PROVIDER_SITE_OTHER): Payer: BC Managed Care – PPO

## 2021-07-31 DIAGNOSIS — J309 Allergic rhinitis, unspecified: Secondary | ICD-10-CM

## 2021-08-11 ENCOUNTER — Ambulatory Visit (INDEPENDENT_AMBULATORY_CARE_PROVIDER_SITE_OTHER): Payer: BC Managed Care – PPO

## 2021-08-11 DIAGNOSIS — J309 Allergic rhinitis, unspecified: Secondary | ICD-10-CM

## 2021-08-22 ENCOUNTER — Ambulatory Visit (INDEPENDENT_AMBULATORY_CARE_PROVIDER_SITE_OTHER): Payer: BC Managed Care – PPO | Admitting: Allergy

## 2021-08-22 ENCOUNTER — Encounter: Payer: Self-pay | Admitting: Allergy

## 2021-08-22 ENCOUNTER — Other Ambulatory Visit: Payer: Self-pay

## 2021-08-22 ENCOUNTER — Ambulatory Visit: Payer: Self-pay

## 2021-08-22 VITALS — BP 106/72 | HR 64 | Temp 98.0°F | Resp 16 | Ht 62.0 in | Wt 179.6 lb

## 2021-08-22 DIAGNOSIS — J309 Allergic rhinitis, unspecified: Secondary | ICD-10-CM

## 2021-08-22 DIAGNOSIS — J4530 Mild persistent asthma, uncomplicated: Secondary | ICD-10-CM

## 2021-08-22 DIAGNOSIS — J3089 Other allergic rhinitis: Secondary | ICD-10-CM

## 2021-08-22 MED ORDER — FLUTICASONE-SALMETEROL 100-50 MCG/ACT IN AEPB: 1.0000 | INHALATION_SPRAY | Freq: Two times a day (BID) | RESPIRATORY_TRACT | 5 refills | Status: DC

## 2021-08-22 MED ORDER — EPINEPHRINE 0.3 MG/0.3ML IJ SOAJ: 0.3000 mg | INTRAMUSCULAR | 2 refills | Status: DC | PRN

## 2021-08-22 NOTE — Patient Instructions (Addendum)
Allergic rhinitis ? - continue avoidance measures for tree pollens, grass pollens, weed pollens, molds, dust mites, cat, dog ? - continue allergy injections and have access to your epinephrine auto-injector (will renew today) ? - continue  Xyzal (levoceterizine) 5mg  once a day and discussed seeing if you can wean each season to as needed use.  Continue to take however on the days of your allergy injections.  ? - Use nasal Ipratropium 2 sprays each nostril 3-4 times a day as needed for nasal drainage ? - Use Flonase (fluticasone) 2 sprays each nostril daily for nasal congestion.  ? ?Asthma ?-Step down to Advair 1 puff twice a day to help prevent cough and wheeze.   If you note any increase in respiratory symptoms or need to use albuterol then go back to the discus dosing.  ?-Continue Singulair (montelukast) 10 mg once a day to help prevent cough and wheeze ?-Use albuterol 2 puffs every 4-6 hours as needed for cough, wheeze, tightness in chest, or shortness of breath ? ?Follow-up 6-12 months or sooner if needed ? ?

## 2021-08-22 NOTE — Progress Notes (Signed)
? ? ?Follow-up Note ? ?RE: Tracey Floyd MRN: [id number] DOB: March 29, 1971 ?Date of Office Visit: 08/22/2021 ? ? ?History of present illness: ?Tracey Floyd is a 51 y.o. female presenting today for follow-up of allergic rhinitis and asthma.  She was last seen in the office on 02/27/2019 by our nurse practitioner Amada Jupiter. ?She has done well since her last visit.  With her allergic rhinitis she is on immunotherapy at maintenance dosing coming every 3 weeks at this time.  She is tolerating injections well without large local or systemic reactions.  She is still taking her Xyzal every day at this time.  She did try to wean a while back and did note increase in symptoms thus she went back on daily dosing of Xyzal.  She has not attempted to take Xyzal wean since she has been on the increased asthma medicines.  She has not required use of her nasal sprays ipratropium or ?Flonase for Congestion or drainage. ?With her asthma she has been taking Advair 250 1 puff twice a day as well as Singulair.  This has controlled her symptoms quite well.  She has not required albuterol use.  She denies daytime or nighttime symptoms.  She has not had any ED or urgent care visits or any systemic steroid needs. ? ? ?Review of systems: ?Review of Systems  ?Constitutional: Negative.   ?HENT: Negative.    ?Eyes: Negative.   ?Respiratory: Negative.    ?Cardiovascular: Negative.   ?Gastrointestinal: Negative.   ?Musculoskeletal: Negative.   ?Skin: Negative.   ?Allergic/Immunologic: Negative.   ?Neurological: Negative.    ? ?All other systems negative unless noted above in HPI ? ?Past medical/social/surgical/family history have been reviewed and are unchanged unless specifically indicated below. ? ?No changes ? ?Medication List: ?Current Outpatient Medications  ?Medication Sig Dispense Refill  ? albuterol (VENTOLIN HFA) 108 (90 Base) MCG/ACT inhaler Inhale 2 puffs into the lungs every 4 (four) hours as needed for wheezing or shortness of  breath. 18 g 2  ? ALPRAZolam (XANAX) 0.5 MG tablet Take by mouth as needed.    ? citalopram (CELEXA) 20 MG tablet 1 tablet    ? fluticasone (FLONASE) 50 MCG/ACT nasal spray Place 2 sprays into both nostrils daily. (Patient taking differently: Place 2 sprays into both nostrils as needed.) 16 g 5  ? fluticasone-salmeterol (ADVAIR DISKUS) 100-50 MCG/ACT AEPB Inhale 1 puff into the lungs 2 (two) times daily. 60 each 5  ? ipratropium (ATROVENT) 0.06 % nasal spray Place 2 sprays into both nostrils 3 (three) times daily. (Patient taking differently: Place 2 sprays into both nostrils as needed.) 15 mL 5  ? levocetirizine (XYZAL) 5 MG tablet Take 1 tablet (5 mg total) by mouth 2 (two) times daily. 180 tablet 1  ? Levonorgestrel (MIRENA IU) 1 application by Intrauterine route once.    ? montelukast (SINGULAIR) 10 MG tablet Take 10 mg by mouth daily.    ? Multiple Vitamins-Minerals (MULTIVITAMIN WITH MINERALS) tablet Take 1 tablet by mouth daily.    ? omeprazole (PRILOSEC) 40 MG capsule Take 40 mg by mouth 2 (two) times daily.    ? EPINEPHrine 0.3 mg/0.3 mL IJ SOAJ injection Inject 0.3 mg into the muscle as needed. 2 each 2  ? ?No current facility-administered medications for this visit.  ?  ? ?Known medication allergies: ?No Known Allergies ? ? ?Physical examination: ?Blood pressure 106/72, pulse 64, temperature 98 ?F (36.7 ?C), temperature source Temporal, resp. rate 16, height 5\' 2"  (1.575 m), weight  179 lb 9.6 oz (81.5 kg), SpO2 99 %. ? ?General: Alert, interactive, in no acute distress. ?HEENT: PERRLA, TMs pearly gray, turbinates non-edematous without discharge, post-pharynx non erythematous. ?Neck: Supple without lymphadenopathy. ?Lungs: Clear to auscultation without wheezing, rhonchi or rales. {no increased work of breathing. ?CV: Normal S1, S2 without murmurs. ?Abdomen: Nondistended, nontender. ?Skin: Warm and dry, without lesions or rashes. ?Extremities:  No clubbing, cyanosis or edema. ?Neuro:   Grossly  intact. ? ?Diagnositics/Labs: ?None today ? ?Assessment and plan: ?Allergic rhinitis ? - continue avoidance measures for tree pollens, grass pollens, weed pollens, molds, dust mites, cat, dog ? - continue allergy injections and have access to your epinephrine auto-injector (will renew today) ? - continue  Xyzal (levoceterizine) 5mg  once a day and discussed seeing if you can wean each season to as needed use.  Continue to take however on the days of your allergy injections.  ? - Use nasal Ipratropium 2 sprays each nostril 3-4 times a day as needed for nasal drainage ? - Use Flonase (fluticasone) 2 sprays each nostril daily for nasal congestion.  ? ?Asthma ?-Step down to Advair 1 puff twice a day to help prevent cough and wheeze.   If you note any increase in respiratory symptoms or need to use albuterol then go back to the discus dosing.  ?-Continue Singulair (montelukast) 10 mg once a day to help prevent cough and wheeze ?-Use albuterol 2 puffs every 4-6 hours as needed for cough, wheeze, tightness in chest, or shortness of breath ? ?Follow-up 6-12 months or sooner if needed ? ? ?I appreciate the opportunity to take part in Hanni's care. Please do not hesitate to contact me with questions. ? ?Sincerely, ? ? ? , MD ?Allergy/Immunology ?[facility] ? ? ?

## 2021-08-26 ENCOUNTER — Other Ambulatory Visit: Payer: Self-pay | Admitting: Family

## 2021-08-26 NOTE — Telephone Encounter (Signed)
Do not refill. Dr. Delorse Lek sent a new prescription for Advair 100/50 at last office visit.

## 2021-08-28 ENCOUNTER — Ambulatory Visit (INDEPENDENT_AMBULATORY_CARE_PROVIDER_SITE_OTHER): Payer: BC Managed Care – PPO

## 2021-08-28 DIAGNOSIS — J309 Allergic rhinitis, unspecified: Secondary | ICD-10-CM

## 2021-09-18 ENCOUNTER — Ambulatory Visit (INDEPENDENT_AMBULATORY_CARE_PROVIDER_SITE_OTHER): Payer: BC Managed Care – PPO

## 2021-09-18 DIAGNOSIS — J309 Allergic rhinitis, unspecified: Secondary | ICD-10-CM | POA: Diagnosis not present

## 2021-10-14 DIAGNOSIS — Z1211 Encounter for screening for malignant neoplasm of colon: Secondary | ICD-10-CM | POA: Diagnosis not present

## 2021-10-14 DIAGNOSIS — K648 Other hemorrhoids: Secondary | ICD-10-CM | POA: Diagnosis not present

## 2021-10-15 ENCOUNTER — Ambulatory Visit (INDEPENDENT_AMBULATORY_CARE_PROVIDER_SITE_OTHER): Payer: BC Managed Care – PPO

## 2021-10-15 DIAGNOSIS — J309 Allergic rhinitis, unspecified: Secondary | ICD-10-CM

## 2021-11-06 ENCOUNTER — Ambulatory Visit (INDEPENDENT_AMBULATORY_CARE_PROVIDER_SITE_OTHER): Payer: BC Managed Care – PPO

## 2021-11-06 DIAGNOSIS — J309 Allergic rhinitis, unspecified: Secondary | ICD-10-CM | POA: Diagnosis not present

## 2021-12-03 ENCOUNTER — Ambulatory Visit (INDEPENDENT_AMBULATORY_CARE_PROVIDER_SITE_OTHER): Payer: BC Managed Care – PPO

## 2021-12-03 DIAGNOSIS — J309 Allergic rhinitis, unspecified: Secondary | ICD-10-CM | POA: Diagnosis not present

## 2021-12-08 DIAGNOSIS — M79603 Pain in arm, unspecified: Secondary | ICD-10-CM | POA: Diagnosis not present

## 2021-12-08 DIAGNOSIS — L659 Nonscarring hair loss, unspecified: Secondary | ICD-10-CM | POA: Diagnosis not present

## 2021-12-08 DIAGNOSIS — R232 Flushing: Secondary | ICD-10-CM | POA: Diagnosis not present

## 2021-12-08 DIAGNOSIS — R202 Paresthesia of skin: Secondary | ICD-10-CM | POA: Diagnosis not present

## 2021-12-11 DIAGNOSIS — J3089 Other allergic rhinitis: Secondary | ICD-10-CM | POA: Diagnosis not present

## 2021-12-11 NOTE — Progress Notes (Signed)
VIALS EXP 12-12-22 

## 2021-12-24 ENCOUNTER — Ambulatory Visit (INDEPENDENT_AMBULATORY_CARE_PROVIDER_SITE_OTHER): Payer: BC Managed Care – PPO

## 2021-12-24 DIAGNOSIS — J309 Allergic rhinitis, unspecified: Secondary | ICD-10-CM

## 2022-01-14 ENCOUNTER — Ambulatory Visit (INDEPENDENT_AMBULATORY_CARE_PROVIDER_SITE_OTHER): Payer: BC Managed Care – PPO

## 2022-01-14 DIAGNOSIS — J309 Allergic rhinitis, unspecified: Secondary | ICD-10-CM | POA: Diagnosis not present

## 2022-02-05 ENCOUNTER — Ambulatory Visit (INDEPENDENT_AMBULATORY_CARE_PROVIDER_SITE_OTHER): Payer: BC Managed Care – PPO | Admitting: *Deleted

## 2022-02-05 DIAGNOSIS — F329 Major depressive disorder, single episode, unspecified: Secondary | ICD-10-CM | POA: Diagnosis not present

## 2022-02-05 DIAGNOSIS — F411 Generalized anxiety disorder: Secondary | ICD-10-CM | POA: Diagnosis not present

## 2022-02-05 DIAGNOSIS — J309 Allergic rhinitis, unspecified: Secondary | ICD-10-CM

## 2022-02-05 DIAGNOSIS — N951 Menopausal and female climacteric states: Secondary | ICD-10-CM | POA: Diagnosis not present

## 2022-02-12 ENCOUNTER — Ambulatory Visit (INDEPENDENT_AMBULATORY_CARE_PROVIDER_SITE_OTHER): Payer: BC Managed Care – PPO | Admitting: *Deleted

## 2022-02-12 DIAGNOSIS — J309 Allergic rhinitis, unspecified: Secondary | ICD-10-CM | POA: Diagnosis not present

## 2022-02-18 ENCOUNTER — Ambulatory Visit (INDEPENDENT_AMBULATORY_CARE_PROVIDER_SITE_OTHER): Payer: BC Managed Care – PPO | Admitting: *Deleted

## 2022-02-18 DIAGNOSIS — J309 Allergic rhinitis, unspecified: Secondary | ICD-10-CM | POA: Diagnosis not present

## 2022-02-26 ENCOUNTER — Ambulatory Visit (INDEPENDENT_AMBULATORY_CARE_PROVIDER_SITE_OTHER): Payer: BC Managed Care – PPO

## 2022-02-26 DIAGNOSIS — J309 Allergic rhinitis, unspecified: Secondary | ICD-10-CM | POA: Diagnosis not present

## 2022-03-05 ENCOUNTER — Ambulatory Visit (INDEPENDENT_AMBULATORY_CARE_PROVIDER_SITE_OTHER): Payer: BC Managed Care – PPO

## 2022-03-05 DIAGNOSIS — J309 Allergic rhinitis, unspecified: Secondary | ICD-10-CM | POA: Diagnosis not present

## 2022-03-31 ENCOUNTER — Ambulatory Visit (INDEPENDENT_AMBULATORY_CARE_PROVIDER_SITE_OTHER): Payer: BC Managed Care – PPO | Admitting: *Deleted

## 2022-03-31 DIAGNOSIS — J309 Allergic rhinitis, unspecified: Secondary | ICD-10-CM | POA: Diagnosis not present

## 2022-04-29 ENCOUNTER — Ambulatory Visit (INDEPENDENT_AMBULATORY_CARE_PROVIDER_SITE_OTHER): Payer: BC Managed Care – PPO

## 2022-04-29 DIAGNOSIS — J309 Allergic rhinitis, unspecified: Secondary | ICD-10-CM | POA: Diagnosis not present

## 2022-05-04 DIAGNOSIS — E782 Mixed hyperlipidemia: Secondary | ICD-10-CM | POA: Diagnosis not present

## 2022-05-04 DIAGNOSIS — F329 Major depressive disorder, single episode, unspecified: Secondary | ICD-10-CM | POA: Diagnosis not present

## 2022-05-04 DIAGNOSIS — Z23 Encounter for immunization: Secondary | ICD-10-CM | POA: Diagnosis not present

## 2022-05-04 DIAGNOSIS — Z0000 Encounter for general adult medical examination without abnormal findings: Secondary | ICD-10-CM | POA: Diagnosis not present

## 2022-05-14 ENCOUNTER — Ambulatory Visit (INDEPENDENT_AMBULATORY_CARE_PROVIDER_SITE_OTHER): Payer: BC Managed Care – PPO

## 2022-05-14 DIAGNOSIS — J309 Allergic rhinitis, unspecified: Secondary | ICD-10-CM | POA: Diagnosis not present

## 2022-05-27 DIAGNOSIS — J3089 Other allergic rhinitis: Secondary | ICD-10-CM | POA: Diagnosis not present

## 2022-05-27 NOTE — Progress Notes (Signed)
VIALS EXP 05-28-23 

## 2022-06-10 ENCOUNTER — Ambulatory Visit (INDEPENDENT_AMBULATORY_CARE_PROVIDER_SITE_OTHER): Payer: BC Managed Care – PPO

## 2022-06-10 DIAGNOSIS — J309 Allergic rhinitis, unspecified: Secondary | ICD-10-CM | POA: Diagnosis not present

## 2022-07-02 ENCOUNTER — Ambulatory Visit (INDEPENDENT_AMBULATORY_CARE_PROVIDER_SITE_OTHER): Payer: BC Managed Care – PPO

## 2022-07-02 DIAGNOSIS — J309 Allergic rhinitis, unspecified: Secondary | ICD-10-CM | POA: Diagnosis not present

## 2022-08-03 ENCOUNTER — Ambulatory Visit (INDEPENDENT_AMBULATORY_CARE_PROVIDER_SITE_OTHER): Payer: BC Managed Care – PPO

## 2022-08-03 DIAGNOSIS — J309 Allergic rhinitis, unspecified: Secondary | ICD-10-CM | POA: Diagnosis not present

## 2022-08-07 ENCOUNTER — Other Ambulatory Visit: Payer: Self-pay | Admitting: Family Medicine

## 2022-08-07 DIAGNOSIS — Z1231 Encounter for screening mammogram for malignant neoplasm of breast: Secondary | ICD-10-CM

## 2022-08-18 DIAGNOSIS — Z6831 Body mass index (BMI) 31.0-31.9, adult: Secondary | ICD-10-CM | POA: Diagnosis not present

## 2022-08-18 DIAGNOSIS — J0100 Acute maxillary sinusitis, unspecified: Secondary | ICD-10-CM | POA: Diagnosis not present

## 2022-08-23 ENCOUNTER — Other Ambulatory Visit: Payer: Self-pay | Admitting: Allergy

## 2022-08-23 ENCOUNTER — Other Ambulatory Visit: Payer: Self-pay | Admitting: Family

## 2022-08-24 ENCOUNTER — Other Ambulatory Visit: Payer: Self-pay | Admitting: Allergy

## 2022-08-31 ENCOUNTER — Ambulatory Visit (INDEPENDENT_AMBULATORY_CARE_PROVIDER_SITE_OTHER): Payer: BC Managed Care – PPO | Admitting: *Deleted

## 2022-08-31 DIAGNOSIS — J309 Allergic rhinitis, unspecified: Secondary | ICD-10-CM

## 2022-09-08 DIAGNOSIS — D485 Neoplasm of uncertain behavior of skin: Secondary | ICD-10-CM | POA: Diagnosis not present

## 2022-09-08 DIAGNOSIS — D2362 Other benign neoplasm of skin of left upper limb, including shoulder: Secondary | ICD-10-CM | POA: Diagnosis not present

## 2022-09-11 ENCOUNTER — Ambulatory Visit (INDEPENDENT_AMBULATORY_CARE_PROVIDER_SITE_OTHER): Payer: BC Managed Care – PPO

## 2022-09-11 DIAGNOSIS — J309 Allergic rhinitis, unspecified: Secondary | ICD-10-CM

## 2022-09-18 ENCOUNTER — Ambulatory Visit (INDEPENDENT_AMBULATORY_CARE_PROVIDER_SITE_OTHER): Payer: BC Managed Care – PPO

## 2022-09-18 ENCOUNTER — Ambulatory Visit
Admission: RE | Admit: 2022-09-18 | Discharge: 2022-09-18 | Disposition: A | Discharge status: Home / Self Care | Payer: BC Managed Care – PPO | Source: Ambulatory Visit | Attending: Family Medicine | Admitting: Family Medicine

## 2022-09-18 DIAGNOSIS — J309 Allergic rhinitis, unspecified: Secondary | ICD-10-CM | POA: Diagnosis not present

## 2022-09-18 DIAGNOSIS — Z1231 Encounter for screening mammogram for malignant neoplasm of breast: Secondary | ICD-10-CM

## 2022-09-24 ENCOUNTER — Ambulatory Visit (INDEPENDENT_AMBULATORY_CARE_PROVIDER_SITE_OTHER): Payer: BC Managed Care – PPO

## 2022-09-24 DIAGNOSIS — J309 Allergic rhinitis, unspecified: Secondary | ICD-10-CM

## 2022-10-01 ENCOUNTER — Ambulatory Visit (INDEPENDENT_AMBULATORY_CARE_PROVIDER_SITE_OTHER): Payer: BC Managed Care – PPO

## 2022-10-01 DIAGNOSIS — J309 Allergic rhinitis, unspecified: Secondary | ICD-10-CM | POA: Diagnosis not present

## 2022-11-05 ENCOUNTER — Ambulatory Visit (INDEPENDENT_AMBULATORY_CARE_PROVIDER_SITE_OTHER): Payer: BC Managed Care – PPO

## 2022-11-05 DIAGNOSIS — J309 Allergic rhinitis, unspecified: Secondary | ICD-10-CM | POA: Diagnosis not present

## 2022-11-09 DIAGNOSIS — F411 Generalized anxiety disorder: Secondary | ICD-10-CM | POA: Diagnosis not present

## 2022-11-09 DIAGNOSIS — G479 Sleep disorder, unspecified: Secondary | ICD-10-CM | POA: Diagnosis not present

## 2022-11-09 DIAGNOSIS — F329 Major depressive disorder, single episode, unspecified: Secondary | ICD-10-CM | POA: Diagnosis not present

## 2022-12-01 ENCOUNTER — Ambulatory Visit (INDEPENDENT_AMBULATORY_CARE_PROVIDER_SITE_OTHER): Payer: BC Managed Care – PPO

## 2022-12-01 DIAGNOSIS — J309 Allergic rhinitis, unspecified: Secondary | ICD-10-CM | POA: Diagnosis not present

## 2022-12-25 DIAGNOSIS — M545 Low back pain, unspecified: Secondary | ICD-10-CM | POA: Diagnosis not present

## 2023-01-08 ENCOUNTER — Ambulatory Visit (INDEPENDENT_AMBULATORY_CARE_PROVIDER_SITE_OTHER)

## 2023-01-08 DIAGNOSIS — J309 Allergic rhinitis, unspecified: Secondary | ICD-10-CM | POA: Diagnosis not present

## 2023-02-09 ENCOUNTER — Ambulatory Visit (INDEPENDENT_AMBULATORY_CARE_PROVIDER_SITE_OTHER): Payer: Self-pay

## 2023-02-09 DIAGNOSIS — J309 Allergic rhinitis, unspecified: Secondary | ICD-10-CM | POA: Diagnosis not present

## 2023-03-10 ENCOUNTER — Ambulatory Visit (INDEPENDENT_AMBULATORY_CARE_PROVIDER_SITE_OTHER): Payer: Self-pay | Admitting: *Deleted

## 2023-03-10 DIAGNOSIS — J309 Allergic rhinitis, unspecified: Secondary | ICD-10-CM | POA: Diagnosis not present

## 2023-03-15 DIAGNOSIS — J302 Other seasonal allergic rhinitis: Secondary | ICD-10-CM | POA: Diagnosis not present

## 2023-03-15 NOTE — Progress Notes (Signed)
VIALS EXP 03-14-24

## 2023-04-08 ENCOUNTER — Ambulatory Visit (INDEPENDENT_AMBULATORY_CARE_PROVIDER_SITE_OTHER): Payer: BC Managed Care – PPO

## 2023-04-08 DIAGNOSIS — J309 Allergic rhinitis, unspecified: Secondary | ICD-10-CM

## 2023-05-06 ENCOUNTER — Ambulatory Visit (INDEPENDENT_AMBULATORY_CARE_PROVIDER_SITE_OTHER): Payer: BC Managed Care – PPO | Admitting: Allergy

## 2023-05-06 ENCOUNTER — Encounter: Payer: Self-pay | Admitting: Allergy

## 2023-05-06 ENCOUNTER — Ambulatory Visit: Payer: Self-pay | Admitting: *Deleted

## 2023-05-06 VITALS — BP 116/82 | HR 90 | Temp 98.2°F | Ht 62.75 in | Wt 202.4 lb

## 2023-05-06 DIAGNOSIS — J4530 Mild persistent asthma, uncomplicated: Secondary | ICD-10-CM

## 2023-05-06 DIAGNOSIS — J309 Allergic rhinitis, unspecified: Secondary | ICD-10-CM

## 2023-05-06 DIAGNOSIS — J3089 Other allergic rhinitis: Secondary | ICD-10-CM | POA: Diagnosis not present

## 2023-05-06 MED ORDER — FLUTICASONE PROPIONATE 50 MCG/ACT NA SUSP: 2.0000 | Freq: Every day | NASAL | 5 refills | Status: DC

## 2023-05-06 MED ORDER — MONTELUKAST SODIUM 10 MG PO TABS: 10.0000 mg | ORAL_TABLET | Freq: Every day | ORAL | 1 refills | Status: DC

## 2023-05-06 MED ORDER — ALBUTEROL SULFATE HFA 108 (90 BASE) MCG/ACT IN AERS: 2.0000 | INHALATION_SPRAY | RESPIRATORY_TRACT | 2 refills | Status: DC | PRN

## 2023-05-06 MED ORDER — EPINEPHRINE 0.3 MG/0.3ML IJ SOAJ: 0.3000 mg | INTRAMUSCULAR | 2 refills | Status: AC | PRN

## 2023-05-06 MED ORDER — PULMICORT FLEXHALER 180 MCG/ACT IN AEPB: 1.0000 | INHALATION_SPRAY | Freq: Two times a day (BID) | RESPIRATORY_TRACT | 5 refills | Status: DC

## 2023-05-06 NOTE — Patient Instructions (Addendum)
Allergic rhinitis  - continue avoidance measures for tree pollens, grass pollens, weed pollens, molds, dust mites, cat, dog  - continue allergy injections and have access to your epinephrine auto-injector   - continue  Xyzal (levoceterizine) 5mg  once a day and discussed trying as needed use during the spring season.  Continue to take however on the days of your allergy injections.  If able to go to as needed use then we will discuss options of discontinuing allergy shots.  - Use nasal Ipratropium 2 sprays each nostril 3-4 times a day as needed for nasal drainage  - Use Flonase (fluticasone) 2 sprays each nostril daily for nasal congestion.   Asthma -Step down to Pulmicort inhaler 1 puff twice a day.  This is a single agent inhaler.    If you note any increase in respiratory symptoms or need to use albuterol then go back to Advair 100 mcg 1 puff twice a day dosing.  -Continue Singulair (montelukast) 10 mg once a day to help prevent cough and wheeze -Use albuterol 2 puffs every 4-6 hours as needed for cough, wheeze, tightness in chest, or shortness of breath  Follow-up 6 months or sooner if needed

## 2023-05-06 NOTE — Progress Notes (Signed)
Follow-up Note  RE: ERNESTA FARNAN MRN: [id number] DOB: [birth year] Date of Office Visit: 05/06/2023   History of present illness: Tracey Floyd is a 52 y.o. female presenting today for follow-up of allergic rhinitis and asthma.  She was last seen in the office on 08/22/2021 by myself. Discussed the use of AI scribe software for clinical note transcription with the patient, who gave verbal consent to proceed.  She has had no major health changes, surgeries, or hospitalizations since the last visit. There have been no flare-ups requiring urgent care, ED visits, or primary care consultations.  However, the patient has needed to use her rescue inhaler, albuterol, sporadically,  two or three times in the past three to four weeks. The patient believes this increase is related to weather changes and possibly seasonal allergies, as she also reports increased mucus drainage during these periods. Despite these occasional increases, the patient's overall albuterol usage remains low, with the patient estimating usage at two to three times quarterly.  The patient has been on a regimen of Advair ( dose, one puff twice a day) and Singulair (montelukast), with no noted differences since stepping down the Advair dosage. The patient also reports using Xyzal daily for allergies, with one instance of needing additional decongestant (Sudafed) during a particularly severe spring allergy season. The patient also uses Ipatropium (Atrovent) for nasal drainage, primarily during the day however she feels like she has a lot of nasal drainage overnight.  The patient is also undergoing allergy shots, currently at a four-week interval, and has been at this interval since February of the current year. The patient's allergy symptoms have been manageable, even during a particularly severe pollen season.     Review of systems: 10pt ROS negative unless noted above in HPI  All other systems negative unless noted  above in HPI  Past medical/social/surgical/family history have been reviewed and are unchanged unless specifically indicated below.  No changes  Medication List: Current Outpatient Medications  Medication Sig Dispense Refill   ALPRAZolam (XANAX) 0.5 MG tablet Take by mouth as needed.     budesonide (PULMICORT FLEXHALER) 180 MCG/ACT inhaler Inhale 1 puff into the lungs in the morning and at bedtime. 1 each 5   citalopram (CELEXA) 20 MG tablet 1 tablet     fluticasone-salmeterol (ADVAIR DISKUS) 100-50 MCG/ACT AEPB Inhale 1 puff into the lungs 2 (two) times daily. 60 each 5   ipratropium (ATROVENT) 0.06 % nasal spray Place 2 sprays into both nostrils 3 (three) times daily. (Patient taking differently: Place 2 sprays into both nostrils as needed.) 15 mL 5   levocetirizine (XYZAL) 5 MG tablet Take 1 tablet (5 mg total) by mouth 2 (two) times daily. 180 tablet 1   Levonorgestrel (MIRENA IU) 1 application by Intrauterine route once.     Multiple Vitamins-Minerals (MULTIVITAMIN WITH MINERALS) tablet Take 1 tablet by mouth daily.     omeprazole (PRILOSEC) 40 MG capsule Take 40 mg by mouth 2 (two) times daily.     albuterol (VENTOLIN HFA) 108 (90 Base) MCG/ACT inhaler Inhale 2 puffs into the lungs every 4 (four) hours as needed for wheezing or shortness of breath. 8.5 each 2   EPINEPHrine 0.3 mg/0.3 mL IJ SOAJ injection Inject 0.3 mg into the muscle as needed. 2 each 2   fluticasone (FLONASE) 50 MCG/ACT nasal spray Place 2 sprays into both nostrils daily. 16 g 5   montelukast (SINGULAIR) 10 MG tablet Take 1 tablet (10 mg total) by mouth daily.  90 tablet 1   No current facility-administered medications for this visit.     Known medication allergies: No Known Allergies   Physical examination: Blood pressure 116/82, pulse 90, temperature 98.2 F (36.8 C), temperature source Temporal, height 5' 2.75" (1.594 m), weight 202 lb 6.4 oz (91.8 kg), SpO2 95%.  General: Alert, interactive, in no acute  distress. HEENT: PERRLA, TMs pearly gray, turbinates non-edematous without discharge, post-pharynx non erythematous. Neck: Supple without lymphadenopathy. Lungs: Clear to auscultation without wheezing, rhonchi or rales. {no increased work of breathing. CV: Normal S1, S2 without murmurs. Abdomen: Nondistended, nontender. Skin: Warm and dry, without lesions or rashes. Extremities:  No clubbing, cyanosis or edema. Neuro:   Grossly intact.  Diagnositics/Labs:  Spirometry: FEV1: 1.91 L 88%, FVC: 2.73 L 102%, ratio consistent with sting obstructive pattern  Assessment and plan:   Allergic rhinitis  - continue avoidance measures for tree pollens, grass pollens, weed pollens, molds, dust mites, cat, dog  - continue allergy injections and have access to your epinephrine auto-injector   - continue  Xyzal (levoceterizine) 5mg  once a day and discussed trying as needed use during the spring season.  Continue to take however on the days of your allergy injections.  If able to go to as needed use then we will discuss options of discontinuing allergy shots.  - Use nasal Ipratropium 2 sprays each nostril 3-4 times a day as needed for nasal drainage  - Use Flonase (fluticasone) 2 sprays each nostril daily for nasal congestion.   Asthma -Step down to Pulmicort inhaler 1 puff twice a day.  This is a single agent inhaler.    If you note any increase in respiratory symptoms or need to use albuterol then go back to Advair 100 mcg 1 puff twice a day dosing.  -Continue Singulair (montelukast) 10 mg once a day to help prevent cough and wheeze -Use albuterol 2 puffs every 4-6 hours as needed for cough, wheeze, tightness in chest, or shortness of breath  Follow-up 6 months or sooner if needed  Return in about 6 months (around 11/04/2023).  I appreciate the opportunity to take part in Tandra's care. Please do not hesitate to contact me with questions.  Sincerely,   Margo Aye,  MD Allergy/Immunology [facility]

## 2023-05-07 NOTE — Addendum Note (Signed)
Addended by: Kellie Simmering, Skip Litke on: 05/07/2023 03:40 PM   Modules accepted: Orders

## 2023-05-12 ENCOUNTER — Ambulatory Visit (INDEPENDENT_AMBULATORY_CARE_PROVIDER_SITE_OTHER): Payer: BC Managed Care – PPO

## 2023-05-12 DIAGNOSIS — J309 Allergic rhinitis, unspecified: Secondary | ICD-10-CM | POA: Diagnosis not present

## 2023-05-17 ENCOUNTER — Ambulatory Visit (INDEPENDENT_AMBULATORY_CARE_PROVIDER_SITE_OTHER): Payer: BC Managed Care – PPO | Admitting: *Deleted

## 2023-05-17 DIAGNOSIS — J309 Allergic rhinitis, unspecified: Secondary | ICD-10-CM | POA: Diagnosis not present

## 2023-06-17 ENCOUNTER — Ambulatory Visit (INDEPENDENT_AMBULATORY_CARE_PROVIDER_SITE_OTHER): Payer: 59

## 2023-06-17 DIAGNOSIS — J309 Allergic rhinitis, unspecified: Secondary | ICD-10-CM

## 2023-07-29 ENCOUNTER — Ambulatory Visit (INDEPENDENT_AMBULATORY_CARE_PROVIDER_SITE_OTHER): Payer: Self-pay

## 2023-07-29 DIAGNOSIS — J309 Allergic rhinitis, unspecified: Secondary | ICD-10-CM

## 2023-08-13 ENCOUNTER — Ambulatory Visit (INDEPENDENT_AMBULATORY_CARE_PROVIDER_SITE_OTHER): Payer: Self-pay | Admitting: *Deleted

## 2023-08-13 DIAGNOSIS — J309 Allergic rhinitis, unspecified: Secondary | ICD-10-CM | POA: Diagnosis not present

## 2023-09-14 ENCOUNTER — Ambulatory Visit (INDEPENDENT_AMBULATORY_CARE_PROVIDER_SITE_OTHER): Payer: Self-pay

## 2023-09-14 DIAGNOSIS — J309 Allergic rhinitis, unspecified: Secondary | ICD-10-CM

## 2023-10-03 IMAGING — MG DIGITAL SCREENING BREAST BILAT IMPLANT W/ TOMO W/ CAD
8 of 12 series · 8 of 28 positions shown · non-contrast
Comparison: Previous exam(s).

CLINICAL DATA: Screening.

EXAM:
DIGITAL SCREENING BILATERAL MAMMOGRAM WITH IMPLANTS, CAD AND
TOMOSYNTHESIS
TECHNIQUE: Bilateral screening digital craniocaudal and mediolateral oblique
mammograms were obtained. Bilateral screening digital breast
tomosynthesis was performed. The images were evaluated with
computer-aided detection. Standard and/or implant displaced views
were performed.

[L CC]
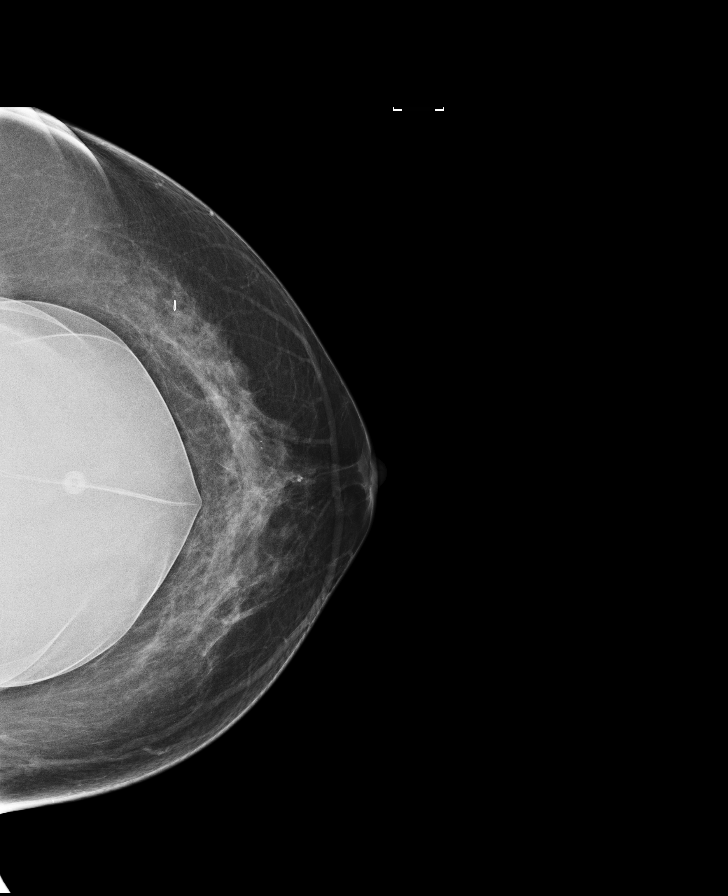

[R MLO]
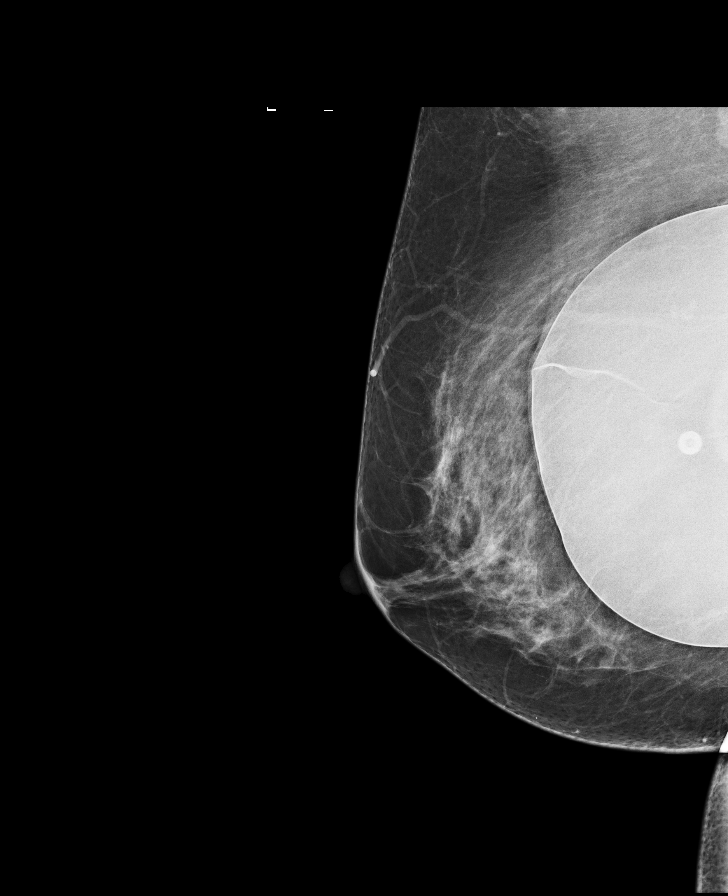

[R CC]
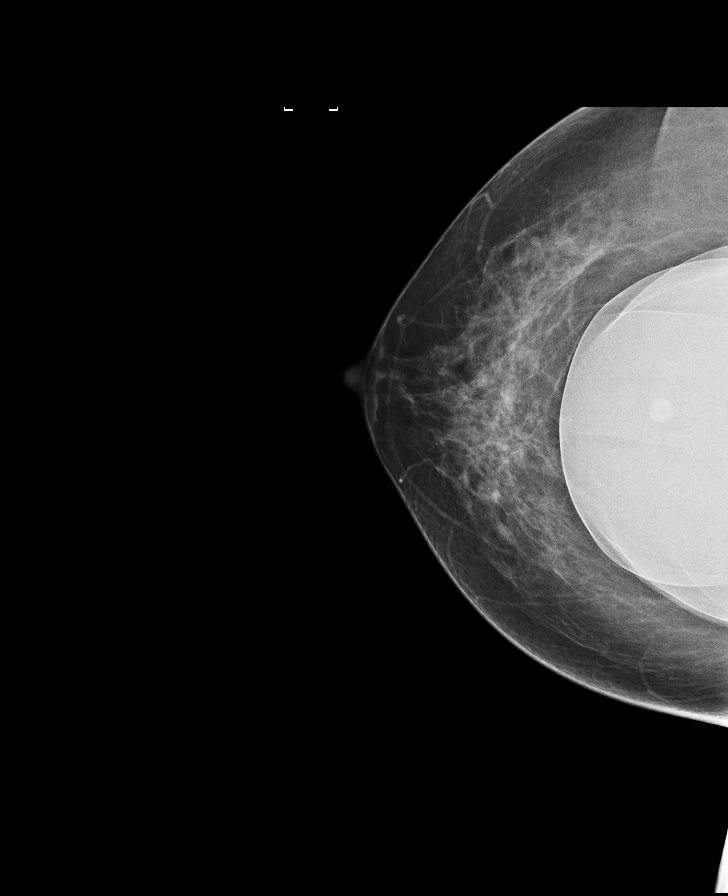

[L MLO]
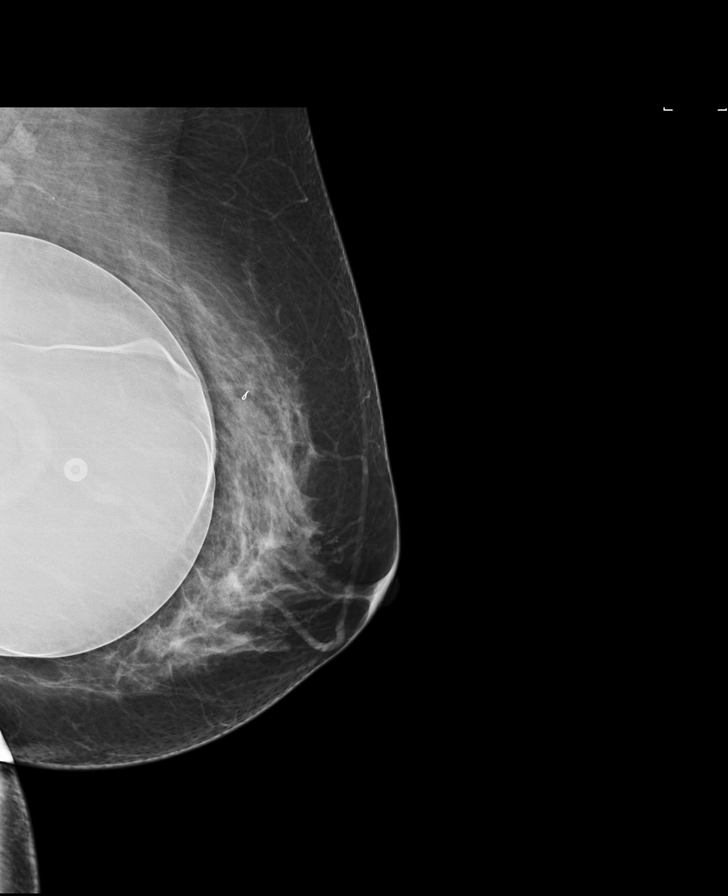

[R CC synth-2D]
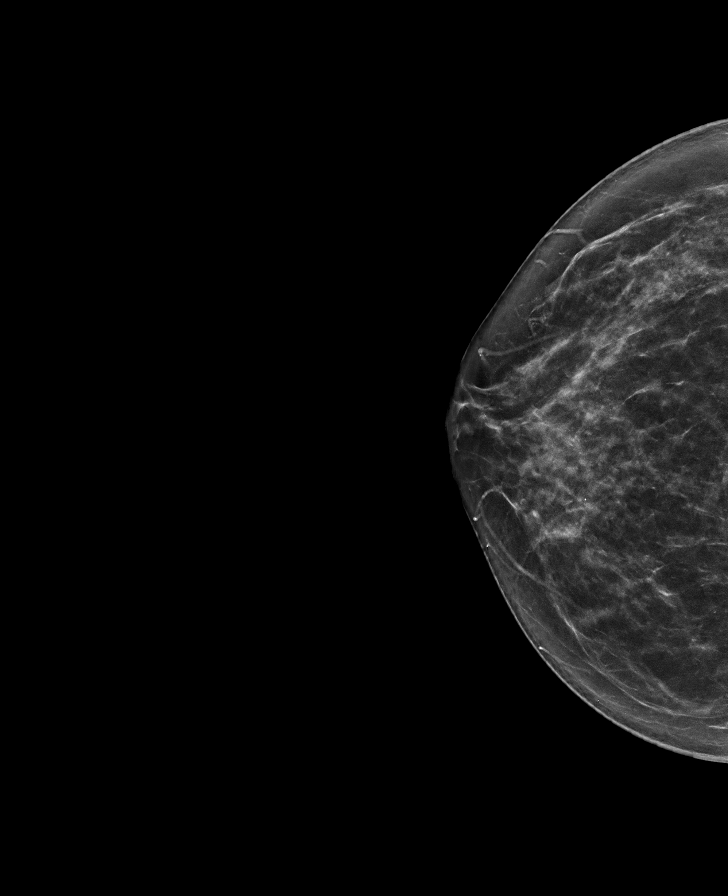

[L CC synth-2D]
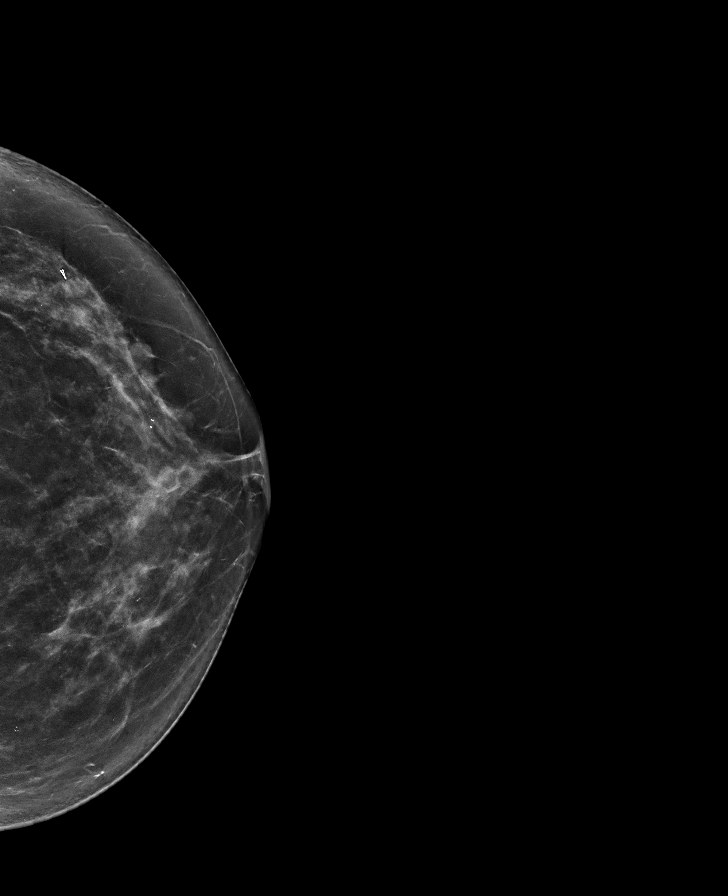

[R MLO synth-2D]
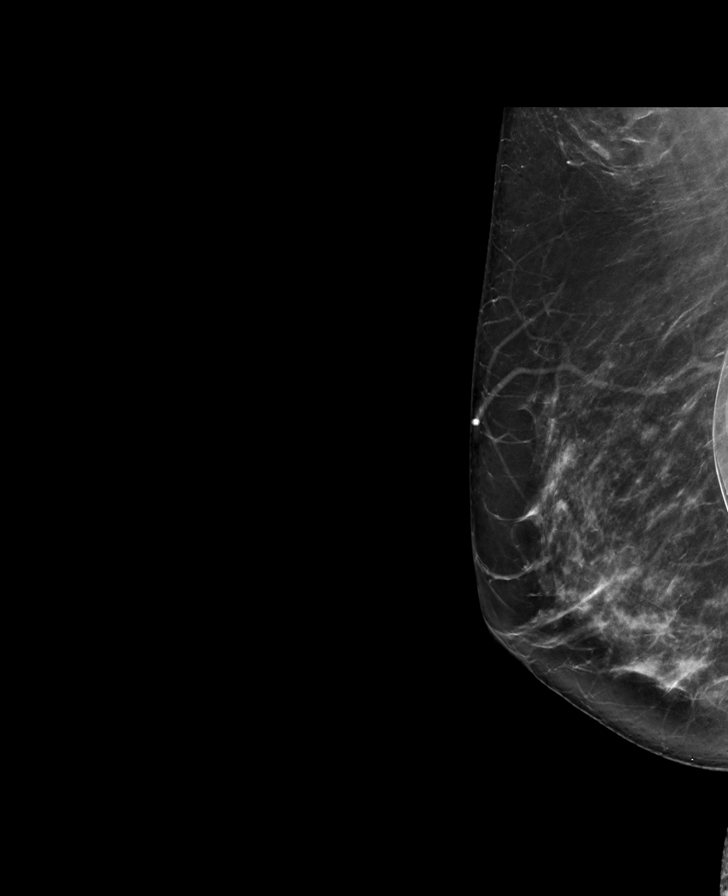

[L MLO synth-2D]
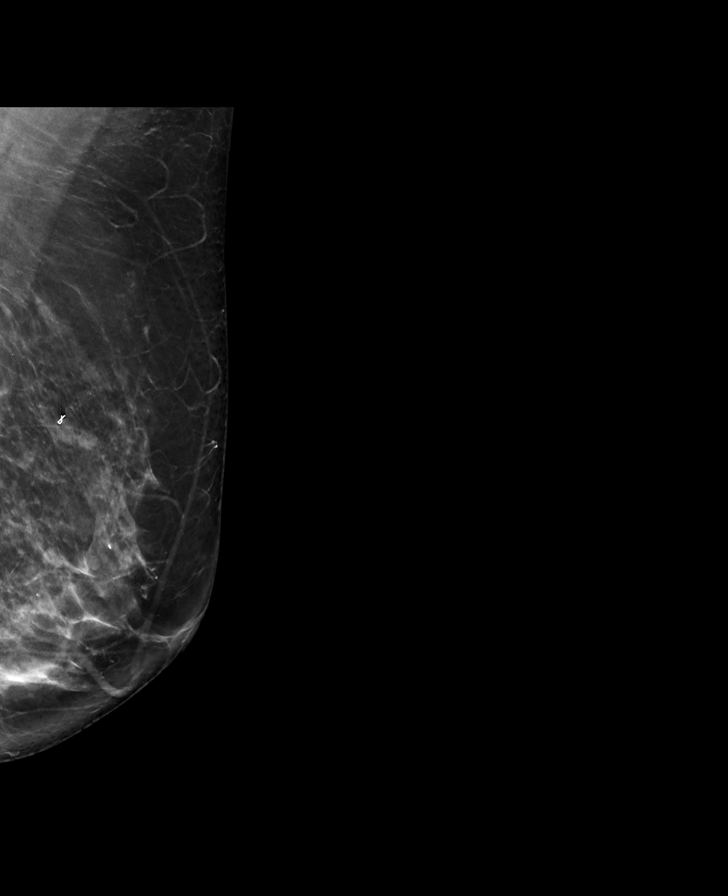

[8 of 28 positions shown; findings below may reference images not displayed]

ACR Breast Density Category c: The breast tissue is heterogeneously
dense, which may obscure small masses.
FINDINGS: The patient has implants. There are no findings suspicious for
malignancy.
IMPRESSION: No mammographic evidence of malignancy. A result letter of this
screening mammogram will be mailed directly to the patient.

RECOMMENDATION:
Screening mammogram in one year. (Code:[id number])

BI-RADS CATEGORY  1:  Negative.

## 2023-10-05 ENCOUNTER — Encounter: Payer: Self-pay | Admitting: Family Medicine

## 2023-10-05 DIAGNOSIS — Z0000 Encounter for general adult medical examination without abnormal findings: Secondary | ICD-10-CM

## 2023-10-14 ENCOUNTER — Other Ambulatory Visit: Payer: Self-pay | Admitting: Family Medicine

## 2023-10-14 DIAGNOSIS — Z1231 Encounter for screening mammogram for malignant neoplasm of breast: Secondary | ICD-10-CM

## 2023-10-18 ENCOUNTER — Ambulatory Visit (INDEPENDENT_AMBULATORY_CARE_PROVIDER_SITE_OTHER): Payer: Self-pay

## 2023-10-18 DIAGNOSIS — J309 Allergic rhinitis, unspecified: Secondary | ICD-10-CM | POA: Diagnosis not present

## 2023-10-29 ENCOUNTER — Ambulatory Visit
Admission: RE | Admit: 2023-10-29 | Discharge: 2023-10-29 | Disposition: A | Discharge status: Home / Self Care | Payer: Self-pay | Source: Ambulatory Visit | Attending: Family Medicine | Admitting: Family Medicine

## 2023-10-29 DIAGNOSIS — Z1231 Encounter for screening mammogram for malignant neoplasm of breast: Secondary | ICD-10-CM

## 2023-11-04 ENCOUNTER — Other Ambulatory Visit: Payer: Self-pay | Admitting: Family Medicine

## 2023-11-04 DIAGNOSIS — R928 Other abnormal and inconclusive findings on diagnostic imaging of breast: Secondary | ICD-10-CM

## 2023-11-10 ENCOUNTER — Ambulatory Visit
Admission: RE | Admit: 2023-11-10 | Discharge: 2023-11-10 | Disposition: A | Discharge status: Home / Self Care | Source: Ambulatory Visit | Attending: Family Medicine | Admitting: Family Medicine

## 2023-11-10 DIAGNOSIS — R928 Other abnormal and inconclusive findings on diagnostic imaging of breast: Secondary | ICD-10-CM

## 2023-11-22 DIAGNOSIS — J3081 Allergic rhinitis due to animal (cat) (dog) hair and dander: Secondary | ICD-10-CM | POA: Diagnosis not present

## 2023-11-22 NOTE — Progress Notes (Signed)
 VIALS MADE 11-22-23

## 2023-11-23 ENCOUNTER — Ambulatory Visit (INDEPENDENT_AMBULATORY_CARE_PROVIDER_SITE_OTHER)

## 2023-11-23 DIAGNOSIS — J309 Allergic rhinitis, unspecified: Secondary | ICD-10-CM | POA: Diagnosis not present

## 2023-11-24 DIAGNOSIS — J3089 Other allergic rhinitis: Secondary | ICD-10-CM | POA: Diagnosis not present

## 2023-11-30 ENCOUNTER — Other Ambulatory Visit: Payer: Self-pay | Admitting: Allergy

## 2023-12-09 ENCOUNTER — Ambulatory Visit (INDEPENDENT_AMBULATORY_CARE_PROVIDER_SITE_OTHER): Payer: BC Managed Care – PPO | Admitting: Allergy

## 2023-12-09 ENCOUNTER — Encounter: Payer: Self-pay | Admitting: Allergy

## 2023-12-09 ENCOUNTER — Other Ambulatory Visit: Payer: Self-pay

## 2023-12-09 VITALS — BP 106/76 | HR 67 | Temp 98.1°F | Resp 16 | Ht 61.81 in | Wt 208.7 lb

## 2023-12-09 DIAGNOSIS — J3089 Other allergic rhinitis: Secondary | ICD-10-CM

## 2023-12-09 DIAGNOSIS — J4530 Mild persistent asthma, uncomplicated: Secondary | ICD-10-CM

## 2023-12-09 MED ORDER — MONTELUKAST SODIUM 10 MG PO TABS: 10.0000 mg | ORAL_TABLET | Freq: Every day | ORAL | 1 refills | Status: DC

## 2023-12-09 MED ORDER — IPRATROPIUM BROMIDE 0.06 % NA SOLN: NASAL | 5 refills | Status: DC

## 2023-12-09 MED ORDER — FLUTICASONE PROPIONATE 50 MCG/ACT NA SUSP: 2.0000 | Freq: Every day | NASAL | 5 refills | Status: DC

## 2023-12-09 MED ORDER — ALBUTEROL SULFATE HFA 108 (90 BASE) MCG/ACT IN AERS: 2.0000 | INHALATION_SPRAY | RESPIRATORY_TRACT | 2 refills | Status: DC | PRN

## 2023-12-09 MED ORDER — PULMICORT FLEXHALER 180 MCG/ACT IN AEPB: 1.0000 | INHALATION_SPRAY | Freq: Two times a day (BID) | RESPIRATORY_TRACT | 5 refills | Status: DC

## 2023-12-09 NOTE — Progress Notes (Signed)
 Follow-up Note  RE: Tracey Floyd MRN: [id number] DOB: November 19, 1970 Date of Office Visit: 12/09/2023   History of present illness: Tracey Floyd is a 53 y.o. female presenting today for follow-up of allergic rhinitis and asthma.  She was last in the office on 05/06/2023 by myself. Discussed the use of AI scribe software for clinical note transcription with the patient, who gave verbal consent to proceed.  Breathing has been stable over the past seven months, with only one episode requiring the use of a rescue inhaler during a period of high allergy activity. No urgent care or emergency department visits have been necessary. She uses Pulmicort  at a reduced dose of one puff twice a day, increasing the dose at night during allergy flare-ups. She continues to use Singulair  daily and a rescue inhaler as needed.  Regarding allergies, she experiences significant mucus drainage down the throat, particularly during the recent challenging pollen season. Ipratropium spray is used more frequently at night to manage this symptom. Flonase  is used for nasal congestion, although stuffiness is rare. Xyzal  is taken for allergy management, and attempts to discontinue it resulted in a significant increase in symptoms within a day. Exposure to different allergens while visiting [place]  exacerbated her symptoms.  Current medications include Pulmicort , Singulair , a rescue inhaler, ipratropium spray, Flonase , and Xyzal .     Review of systems: 10pt ROS negative unless noted above in HPI  Past medical/social/surgical/family history have been reviewed and are unchanged unless specifically indicated below.  No changes  Medication List: Current Outpatient Medications  Medication Sig Dispense Refill   citalopram (CELEXA) 20 MG tablet 1 tablet     EPINEPHrine  0.3 mg/0.3 mL IJ SOAJ injection Inject 0.3 mg into the muscle as needed. 2 each 2   levocetirizine (XYZAL ) 5 MG tablet Take 1 tablet (5 mg  total) by mouth 2 (two) times daily. (Patient taking differently: Take 5 mg by mouth 2 (two) times daily as needed for allergies.) 180 tablet 1   Levonorgestrel (MIRENA IU) 1 application by Intrauterine route once.     Multiple Vitamins-Minerals (MULTIVITAMIN WITH MINERALS) tablet Take 1 tablet by mouth daily.     omeprazole (PRILOSEC) 40 MG capsule Take 40 mg by mouth 2 (two) times daily.     albuterol  (VENTOLIN  HFA) 108 (90 Base) MCG/ACT inhaler Inhale 2 puffs into the lungs every 4 (four) hours as needed for wheezing or shortness of breath. 8.5 each 2   ALPRAZolam (XANAX) 0.5 MG tablet Take by mouth as needed. (Patient not taking: Reported on 12/09/2023)     budesonide  (PULMICORT  FLEXHALER) 180 MCG/ACT inhaler Inhale 1 puff into the lungs in the morning and at bedtime. 1 each 5   fluticasone  (FLONASE ) 50 MCG/ACT nasal spray Place 2 sprays into both nostrils daily. 16 g 5   ipratropium (ATROVENT ) 0.06 % nasal spray 2 sprays each nostril 3-4 times a day as needed for nasal drainage 15 mL 5   montelukast  (SINGULAIR ) 10 MG tablet Take 1 tablet (10 mg total) by mouth daily. 90 tablet 1   No current facility-administered medications for this visit.     Known medication allergies: No Known Allergies   Physical examination: Blood pressure 106/76, pulse 67, temperature 98.1 F (36.7 C), temperature source Temporal, resp. rate 16, height 5' 1.81 (1.57 m), weight 208 lb 11.2 oz (94.7 kg), SpO2 93%.  General: Alert, interactive, in no acute distress. HEENT: PERRLA, TMs pearly gray, turbinates non-edematous without discharge, post-pharynx non erythematous. Neck: Supple without lymphadenopathy.  Lungs: Clear to auscultation without wheezing, rhonchi or rales. {no increased work of breathing. CV: Normal S1, S2 without murmurs. Abdomen: Nondistended, nontender. Skin: Warm and dry, without lesions or rashes. Extremities:  No clubbing, cyanosis or edema. Neuro:   Grossly  intact.  Diagnostics/Labs:  Spirometry: FEV1: 1.73L 80%, FVC: 2.58L 97%, ratio consistent with nonobstructive pattern  Assessment and plan: Allergic rhinitis  - continue avoidance measures for tree pollens, grass pollens, weed pollens, molds, dust mites, cat, dog  - continue allergy injections and have access to your epinephrine  auto-injector   - continue  Xyzal  (levoceterizine) 5mg  once a day.  Continue to take however on the days of your allergy injections.  If able to go to as needed use then we will discuss options of discontinuing allergy shots.  - Use nasal Ipratropium 2 sprays each nostril 3-4 times a day as needed for nasal drainage  - Use Flonase  (fluticasone ) 2 sprays each nostril daily for nasal congestion.   Asthma - Continue Pulmicort  inhaler 1 puff twice a day.  If you have a respiratory illness or flare then increase Pulmicort  to 2 puffs twice a day for 1-2 weeks at a time before going back to maintenance dosing.  -Continue Singulair  (montelukast ) 10 mg once a day to help prevent cough and wheeze -Use albuterol  2 puffs every 4-6 hours as needed for cough, wheeze, tightness in chest, or shortness of breath  Follow-up 6 months or sooner if needed  I appreciate the opportunity to take part in Emara's care. Please do not hesitate to contact me with questions.  Sincerely,   Danita Brain, MD Allergy/Immunology [facility] 

## 2023-12-09 NOTE — Patient Instructions (Signed)
 Allergic rhinitis  - continue avoidance measures for tree pollens, grass pollens, weed pollens, molds, dust mites, cat, dog  - continue allergy injections and have access to your epinephrine  auto-injector   - continue  Xyzal  (levoceterizine) 5mg  once a day.  Continue to take however on the days of your allergy injections.  If able to go to as needed use then we will discuss options of discontinuing allergy shots.  - Use nasal Ipratropium 2 sprays each nostril 3-4 times a day as needed for nasal drainage  - Use Flonase  (fluticasone ) 2 sprays each nostril daily for nasal congestion.   Asthma - Continue Pulmicort  inhaler 1 puff twice a day.  If you have a respiratory illness or flare then increase Pulmicort  to 2 puffs twice a day for 1-2 weeks at a time before going back to maintenance dosing.  -Continue Singulair  (montelukast ) 10 mg once a day to help prevent cough and wheeze -Use albuterol  2 puffs every 4-6 hours as needed for cough, wheeze, tightness in chest, or shortness of breath  Follow-up 6 months or sooner if needed

## 2023-12-21 ENCOUNTER — Ambulatory Visit (INDEPENDENT_AMBULATORY_CARE_PROVIDER_SITE_OTHER)

## 2023-12-21 DIAGNOSIS — J309 Allergic rhinitis, unspecified: Secondary | ICD-10-CM

## 2024-01-17 ENCOUNTER — Ambulatory Visit (INDEPENDENT_AMBULATORY_CARE_PROVIDER_SITE_OTHER)

## 2024-01-17 DIAGNOSIS — J309 Allergic rhinitis, unspecified: Secondary | ICD-10-CM

## 2024-01-27 ENCOUNTER — Ambulatory Visit (INDEPENDENT_AMBULATORY_CARE_PROVIDER_SITE_OTHER)

## 2024-01-27 DIAGNOSIS — J309 Allergic rhinitis, unspecified: Secondary | ICD-10-CM

## 2024-02-03 ENCOUNTER — Ambulatory Visit (INDEPENDENT_AMBULATORY_CARE_PROVIDER_SITE_OTHER)

## 2024-02-03 DIAGNOSIS — J309 Allergic rhinitis, unspecified: Secondary | ICD-10-CM | POA: Diagnosis not present

## 2024-03-10 ENCOUNTER — Ambulatory Visit

## 2024-03-10 DIAGNOSIS — J309 Allergic rhinitis, unspecified: Secondary | ICD-10-CM

## 2024-04-25 ENCOUNTER — Ambulatory Visit (INDEPENDENT_AMBULATORY_CARE_PROVIDER_SITE_OTHER)

## 2024-04-25 DIAGNOSIS — J309 Allergic rhinitis, unspecified: Secondary | ICD-10-CM | POA: Diagnosis not present

## 2024-05-18 ENCOUNTER — Ambulatory Visit (INDEPENDENT_AMBULATORY_CARE_PROVIDER_SITE_OTHER)

## 2024-05-18 DIAGNOSIS — J309 Allergic rhinitis, unspecified: Secondary | ICD-10-CM | POA: Diagnosis not present

## 2024-06-13 ENCOUNTER — Ambulatory Visit: Admitting: Allergy & Immunology

## 2024-06-14 ENCOUNTER — Other Ambulatory Visit: Payer: Self-pay | Admitting: Allergy

## 2024-06-14 ENCOUNTER — Other Ambulatory Visit: Payer: Self-pay

## 2024-06-14 ENCOUNTER — Ambulatory Visit (INDEPENDENT_AMBULATORY_CARE_PROVIDER_SITE_OTHER)

## 2024-06-14 ENCOUNTER — Encounter: Payer: Self-pay | Admitting: Allergy

## 2024-06-14 ENCOUNTER — Ambulatory Visit (INDEPENDENT_AMBULATORY_CARE_PROVIDER_SITE_OTHER): Admitting: Allergy

## 2024-06-14 VITALS — BP 102/70 | HR 74 | Temp 98.2°F | Resp 18 | Ht 63.5 in | Wt 182.9 lb

## 2024-06-14 DIAGNOSIS — J302 Other seasonal allergic rhinitis: Secondary | ICD-10-CM

## 2024-06-14 DIAGNOSIS — J4530 Mild persistent asthma, uncomplicated: Secondary | ICD-10-CM | POA: Diagnosis not present

## 2024-06-14 DIAGNOSIS — J3089 Other allergic rhinitis: Secondary | ICD-10-CM

## 2024-06-14 MED ORDER — FLUTICASONE PROPIONATE 50 MCG/ACT NA SUSP: NASAL | 5 refills | Status: AC

## 2024-06-14 MED ORDER — IPRATROPIUM BROMIDE 0.06 % NA SOLN: NASAL | 5 refills | Status: AC

## 2024-06-14 MED ORDER — MONTELUKAST SODIUM 10 MG PO TABS: ORAL_TABLET | ORAL | 1 refills | Status: DC

## 2024-06-14 MED ORDER — PULMICORT FLEXHALER 180 MCG/ACT IN AEPB: INHALATION_SPRAY | RESPIRATORY_TRACT | 5 refills | Status: AC

## 2024-06-14 MED ORDER — ALBUTEROL SULFATE HFA 108 (90 BASE) MCG/ACT IN AERS: 2.0000 | INHALATION_SPRAY | RESPIRATORY_TRACT | 1 refills | Status: AC | PRN

## 2024-06-14 NOTE — Patient Instructions (Addendum)
 Allergic rhinitis  - continue avoidance measures for tree pollens, grass pollens, weed pollens, molds, dust mites, cat, dog  - continue allergy injections and have access to your epinephrine  auto-injector.  You are approaching the end.  Discussed today was to discontinue shots and have elected to update testing first.  Schedule skin testing visit and hold antihistamines 3 days prior to this day.   - continue  Xyzal  (levoceterizine) 5mg  daily as needed. Continue to take however on the days of your allergy injections.  If able to go to as needed use then we will discuss options of discontinuing allergy shots.  - Use nasal Ipratropium 2 sprays each nostril 3-4 times a day as needed for nasal drainage  - Use Flonase  (fluticasone ) 2 sprays each nostril daily as needed for nasal congestion.   Asthma - under good control - will trial off Pulmicort  at this time.  If symptoms increase or not meeting the below goals then resume Pulmicort  1 puffs twice a day use.   - Asthma action plan: If you have a respiratory illness or flare then use Pulmicort  2 puffs twice a day for 1-2 weeks at a time before going back to maintenance dosing.  -Continue Singulair  (montelukast ) 10 mg once a day to help prevent cough and wheeze -Use albuterol  2 puffs every 4-6 hours as needed for cough, wheeze, tightness in chest, or shortness of breath  Schedule skin testing visit and hold antihistamines 3 days prior to this day.  Routine follow-up 6 months or sooner if needed

## 2024-06-14 NOTE — Progress Notes (Signed)
 "   Follow-up Note  RE: Tracey Floyd MRN: [id number] DOB: 08-29-1970 Date of Office Visit: 06/14/2024   History of present illness: Tracey Floyd is a 54 y.o. female presenting today for follow-up of allergic rhinitis and asthma. She was last seen in the office on 12/09/2023 by myself. Discussed the use of AI scribe software for clinical note transcription with the patient, who gave verbal consent to proceed.  There have been no major health changes, new diagnoses, or hospitalizations since the summer. No respiratory illnesses have occurred this cold and flu season.  She has not needed her rescue inhaler since the summer. She continues to use Pulmicort  at a reduced dose of one puff twice a day.  She continues to take Singulair  daily as well.  She notes that this is the first time she completed spirometry without coughing.  Allergy symptoms have been manageable over the past six months. She has been able to go for periods without taking Xyzal , except during a visit to her husband's daughter's home, where she was exposed to two big dogs and a real Christmas tree. During this time, she resumed Xyzal  and managed well. Nasal spray is used very rarely, primarily at night for drainage.  She has been on allergy shots for five years and is considering retesting to evaluate the need for continued shots, especially due to a cat in her home.  Current medications include Singulair  (montelukast ) for respiratory control, Pulmicort  one puff twice a day, and Xyzal  as needed. She also has access to an epinephrine  device while on allergy shots. She continues on allergy shots at maintenance dosing once a month at this time.  Review of systems: 10pt ROS negative unless noted above in HPI   Past medical/social/surgical/family history have been reviewed and are unchanged unless specifically indicated below.  No changes  Medication List: Current Outpatient Medications  Medication Sig Dispense Refill    albuterol  (VENTOLIN  HFA) 108 (90 Base) MCG/ACT inhaler Inhale 2 puffs into the lungs every 4 (four) hours as needed for wheezing or shortness of breath. 8.5 each 2   ALPRAZolam (XANAX) 0.5 MG tablet Take by mouth as needed.     budesonide  (PULMICORT  FLEXHALER) 180 MCG/ACT inhaler Inhale 1 puff into the lungs in the morning and at bedtime. 1 each 5   citalopram (CELEXA) 20 MG tablet 1 tablet     EPINEPHrine  0.3 mg/0.3 mL IJ SOAJ injection Inject 0.3 mg into the muscle as needed. 2 each 2   fluticasone  (FLONASE ) 50 MCG/ACT nasal spray Place 2 sprays into both nostrils daily. 16 g 5   ipratropium (ATROVENT ) 0.06 % nasal spray 2 sprays each nostril 3-4 times a day as needed for nasal drainage 15 mL 5   levocetirizine (XYZAL ) 5 MG tablet Take 1 tablet (5 mg total) by mouth 2 (two) times daily. (Patient taking differently: Take 5 mg by mouth 2 (two) times daily as needed for allergies.) 180 tablet 1   Levonorgestrel (MIRENA IU) 1 application by Intrauterine route once.     montelukast  (SINGULAIR ) 10 MG tablet Take 1 tablet (10 mg total) by mouth daily. 90 tablet 1   Multiple Vitamins-Minerals (MULTIVITAMIN WITH MINERALS) tablet Take 1 tablet by mouth daily.     omeprazole (PRILOSEC) 40 MG capsule Take 40 mg by mouth 2 (two) times daily.     No current facility-administered medications for this visit.     Known medication allergies: Allergies[1]   Physical examination: Blood pressure 102/70, pulse 74, temperature 98.2  F (36.8 C), resp. rate 18, height 5' 3.5 (1.613 m), weight 182 lb 14.4 oz (83 kg), SpO2 95%.  General: Alert, interactive, in no acute distress. HEENT: PERRLA, TMs pearly gray, turbinates non-edematous without discharge, post-pharynx non erythematous. Neck: Supple without lymphadenopathy. Lungs: Clear to auscultation without wheezing, rhonchi or rales. {no increased work of breathing. CV: Normal S1, S2 without murmurs. Abdomen: Nondistended, nontender. Skin: Warm and dry,  without lesions or rashes. Extremities:  No clubbing, cyanosis or edema. Neuro:   Grossly intact.  Diagnostics/Labs:  Spirometry: FEV1: 1.91L 89%, FVC: 3.2L 120%, ratio consistent with nonobstructive pattern,  Assessment and plan:   Allergic rhinitis  - continue avoidance measures for tree pollens, grass pollens, weed pollens, molds, dust mites, cat, dog  - continue allergy injections and have access to your epinephrine  auto-injector.  You are approaching the end.  Discussed today was to discontinue shots and have elected to update testing first.  Schedule skin testing visit and hold antihistamines 3 days prior to this day.   - continue  Xyzal  (levoceterizine) 5mg  daily as needed. Continue to take however on the days of your allergy injections.  If able to go to as needed use then we will discuss options of discontinuing allergy shots.  - Use nasal Ipratropium 2 sprays each nostril 3-4 times a day as needed for nasal drainage  - Use Flonase  (fluticasone ) 2 sprays each nostril daily as needed for nasal congestion.   Asthma - under good control - will trial off Pulmicort  at this time.  If symptoms increase or not meeting the below goals then resume Pulmicort  1 puffs twice a day use.   - Asthma action plan: If you have a respiratory illness or flare then use Pulmicort  2 puffs twice a day for 1-2 weeks at a time before going back to maintenance dosing.  -Continue Singulair  (montelukast ) 10 mg once a day to help prevent cough and wheeze -Use albuterol  2 puffs every 4-6 hours as needed for cough, wheeze, tightness in chest, or shortness of breath  Schedule skin testing visit and hold antihistamines 3 days prior to this day.  (Env 1-55) Routine follow-up 6 months or sooner if needed  I appreciate the opportunity to take part in Tracey Floyd's care. Please do not hesitate to contact me with questions.  Sincerely,   Danita Brain, MD Allergy/Immunology [facility]       [1] No Known Allergies  "

## 2024-06-14 NOTE — Addendum Note (Signed)
 Addended by: MARCINE ISAIAH CROME on: 06/14/2024 04:54 PM   Modules accepted: Orders

## 2024-06-28 NOTE — Progress Notes (Signed)
 VIALS MADE ON 06/28/24

## 2024-06-29 ENCOUNTER — Ambulatory Visit (INDEPENDENT_AMBULATORY_CARE_PROVIDER_SITE_OTHER)

## 2024-06-29 ENCOUNTER — Ambulatory Visit: Admitting: Podiatry

## 2024-06-29 ENCOUNTER — Encounter: Payer: Self-pay | Admitting: Podiatry

## 2024-06-29 DIAGNOSIS — M7741 Metatarsalgia, right foot: Secondary | ICD-10-CM

## 2024-06-29 DIAGNOSIS — M7742 Metatarsalgia, left foot: Secondary | ICD-10-CM | POA: Diagnosis not present

## 2024-06-30 ENCOUNTER — Encounter: Payer: Self-pay | Admitting: Podiatry

## 2024-06-30 NOTE — Progress Notes (Signed)
 "  Subjective:  Patient ID: MARSA MATTEO, female    DOB: 06/29/1970,  MRN: [id number]  Chief Complaint  Patient presents with   Foot Pain    I have foot pad atrophy on both feet but the left foot is the one that's really started to bother me. (3rd met)    Discussed the use of AI scribe software for clinical note transcription with the patient, who gave verbal consent to proceed.  History of Present Illness BABS DABBS is a 54 year old female with bilateral plantar fat pad atrophy who presents with worsening left foot pain and new onset shooting pain in the left third toe.  She has a history of bilateral plantar fat pad atrophy since childhood, resulting in chronic discomfort in both feet. Symptoms have progressively worsened, with the left foot now more symptomatic than the right.  She recently developed shooting pain localized to the left third toe. The pain is described as shooting and occurs even in the absence of weight-bearing, such as when removing shoes at night. She is concerned that the pain is now present without pressure and may be related to nerve irritation.  She does not use prescribed orthotics but consistently wears platform, maximum cushion shoes, as these are the only types she can tolerate. She has not undergone any prior corrective procedures or interventions. Surgical options, including metatarsal osteotomy, have been offered in the past but she has declined these interventions.  She requires shoe accommodations at work due to her foot condition and requests documentation to allow for appropriate footwear.      Objective:    Physical Exam VASCULAR: DP and PT pulse palpable. Foot is warm and well-perfused. Capillary fill time is brisk. DERMATOLOGIC: Normal skin turgor, texture, and temperature. No open lesions, rashes, or ulcerations. NEUROLOGIC: Normal sensation to light touch and pressure. No paresthesias on examination. ORTHOPEDIC: Diffuse fat pad  atrophy across metatarsals bilaterally with diffuse callus sub metatarsal 2, 3, 4 bilaterally, more painful on left than right. Mild tenderness on palpation of left third submetatarsal area. Smooth pain-free range of motion of all examined joints. No ecchymosis or bruising. No gross deformity.   No images are attached to the encounter.    Results Radiology Left foot radiograph (06/29/2024): Adequate metatarsal parabola without fracture, stress fracture, or other abnormality. (Independently interpreted)   Assessment:     ICD-10-CM   1. Metatarsalgia of left foot  M77.42 DG Foot Complete Left    2. Metatarsalgia of right foot  M77.41         Plan:  Patient was evaluated and treated and all questions answered.  Assessment and Plan Assessment & Plan Metatarsalgia with plantar fat pad atrophy, left greater than right Chronic, progressive plantar fat pad atrophy with metatarsalgia, more symptomatic on the left. Recent onset of shooting pain in the left third toe raises concern for early neuroma, though not clinically evident. Radiographs show no structural abnormality or fracture. Condition is longstanding and worsens with age. Surgical intervention previously offered and declined. Risks of steroid injection include further fat pad loss. Lumify fat pad matrix injection discussed but deferred due to cost and lack of insurance coverage. Custom orthotics with maximal metatarsal padding expected to provide symptomatic relief, though may require shoe accommodations due to bulk. - Obtained foot scans for custom orthotics with accommodative material and maximal metatarsal padding to offload metatarsal heads. - Planned inclusion of a large metatarsal pad in the orthotic, with option to downsize if discomfort occurs. -  Provided education regarding the need for roomy footwear to accommodate orthotics and padding. - Deferred leneva fat pad matrix injection due to high out-of-pocket cost and lack of  insurance coverage. - Deferred steroid injection due to risk of accelerating fat pad loss. - Discussed surgical options (metatarsal osteotomy) as a last resort; she declined at this time. - Provided work note for shoe accommodations due to medical necessity. - Instructed her to return for orthotic fitting and adjustment as needed after fabrication.      No follow-ups on file.   "

## 2024-07-19 ENCOUNTER — Ambulatory Visit: Admitting: Allergy

## 2024-12-13 ENCOUNTER — Ambulatory Visit: Admitting: Allergy
